# Patient Record
Sex: Male | Born: 1955 | Race: White | Hispanic: No | Smoking: Never smoker
Health system: Southern US, Community
[De-identification: ages and names within clinical notes are randomized; demographics above are authoritative.]

## PROBLEM LIST (undated history)

## (undated) HISTORY — PX: NO PAST SURGERIES: SHX2092

---

## 2016-08-20 ENCOUNTER — Ambulatory Visit
Admission: EM | Admit: 2016-08-20 | Discharge: 2016-08-20 | Disposition: A | Discharge status: Home / Self Care | Payer: BC Managed Care – PPO | Attending: Family Medicine | Admitting: Family Medicine

## 2016-08-20 ENCOUNTER — Ambulatory Visit (INDEPENDENT_AMBULATORY_CARE_PROVIDER_SITE_OTHER): Payer: BC Managed Care – PPO

## 2016-08-20 DIAGNOSIS — R109 Unspecified abdominal pain: Secondary | ICD-10-CM

## 2016-08-20 LAB — COMPREHENSIVE METABOLIC PANEL
ALT: 29 U/L (ref 17–63)
AST: 24 U/L (ref 15–41)
Albumin: 4.6 g/dL (ref 3.5–5.0)
Alkaline Phosphatase: 62 U/L (ref 38–126)
Anion gap: 10 (ref 5–15)
BUN: 16 mg/dL (ref 6–20)
CO2: 24 mmol/L (ref 22–32)
Calcium: 9 mg/dL (ref 8.9–10.3)
Chloride: 101 mmol/L (ref 101–111)
Creatinine, Ser: 1.11 mg/dL (ref 0.61–1.24)
GFR calc Af Amer: >60 mL/min (ref >60)
GFR calc non Af Amer: >60 mL/min (ref >60)
Glucose, Bld: 152 mg/dL — ABNORMAL HIGH (ref 65–99)
Potassium: 3.9 mmol/L (ref 3.5–5.1)
Sodium: 135 mmol/L (ref 135–145)
Total Bilirubin: 1.2 mg/dL (ref 0.3–1.2)
Total Protein: 8.3 g/dL — ABNORMAL HIGH (ref 6.5–8.1)

## 2016-08-20 LAB — CBC WITH DIFFERENTIAL/PLATELET
Basophils Absolute: 0 10*3/uL (ref 0–0.1)
Basophils Relative: 0 %
Eosinophils Absolute: 0 10*3/uL (ref 0–0.7)
Eosinophils Relative: 0 %
HCT: 50.2 % (ref 40.0–52.0)
Hemoglobin: 17.1 g/dL (ref 13.0–18.0)
Lymphocytes Relative: 3 %
Lymphs Abs: 0.7 10*3/uL — ABNORMAL LOW (ref 1.0–3.6)
MCH: 28.5 pg (ref 26.0–34.0)
MCHC: 34 g/dL (ref 32.0–36.0)
MCV: 83.8 fL (ref 80.0–100.0)
Monocytes Absolute: 2.6 10*3/uL — ABNORMAL HIGH (ref 0.2–1.0)
Monocytes Relative: 11 %
Neutro Abs: 20.7 10*3/uL — ABNORMAL HIGH (ref 1.4–6.5)
Neutrophils Relative %: 86 %
Platelets: 256 10*3/uL (ref 150–440)
RBC: 5.99 MIL/uL — ABNORMAL HIGH (ref 4.40–5.90)
RDW: 12.8 % (ref 11.5–14.5)
WBC: 24 10*3/uL — ABNORMAL HIGH (ref 3.8–10.6)

## 2016-08-20 LAB — URINALYSIS COMPLETE WITH MICROSCOPIC (ARMC ONLY)
Bacteria, UA: NONE SEEN
Bilirubin Urine: NEGATIVE
Glucose, UA: NEGATIVE mg/dL
Ketones, ur: NEGATIVE mg/dL
Leukocytes, UA: NEGATIVE
Nitrite: NEGATIVE
Protein, ur: 100 mg/dL — AB
Specific Gravity, Urine: >1.03 — ABNORMAL HIGH (ref 1.005–1.030)
Squamous Epithelial / LPF: NONE SEEN
pH: 5.5 (ref 5.0–8.0)

## 2016-08-20 LAB — LIPASE, BLOOD: Lipase: 19 U/L (ref 11–51)

## 2016-08-20 MED ORDER — ONDANSETRON 8 MG PO TBDP
8.0000 mg | ORAL_TABLET | Freq: Once | ORAL | Status: AC
  Administered 2016-08-20: 8 mg via ORAL

## 2016-08-20 MED ORDER — ONDANSETRON 8 MG PO TBDP: 8.0000 mg | ORAL_TABLET | Freq: Two times a day (BID) | ORAL | 0 refills | Status: DC

## 2016-08-20 NOTE — ED Provider Notes (Signed)
CSN: [id number]     Arrival date & time 08/20/16  4098 History   First MD Initiated Contact with Patient 08/20/16 1143     Chief Complaint  Patient presents with  . Abdominal Pain   (Consider location/radiation/quality/duration/timing/severity/associated sxs/prior Treatment) HPI    60 year old male who presents with right flank mid quadrant abdominal pain that began yesterday. He states that the pain is not constant but is densely affected by movement. If he lies recumbent and draws his legs upwards towards his chest pain is lessened. He has had some vomiting mostly yesterday with his last episode at 345 this morning. He denies any coffee ground vomitus or any blood in the vomitus. He has had no diarrhea has been slightly constipated. He attempted to have a bowel movement this morning with only a very small amount. He said no bleeding per rectum and has noticed no mucus. He has had kidney stones in the past and he states that this does not seem to be anywhere near that pain. Has been attempting to drink fluids and is able to tolerate water to limited degree. He has not been eating because of his nausea which she states is constant. He states that he does have hunger.      History reviewed. No pertinent past medical history. Past Surgical History:  Procedure Laterality Date  . NO PAST SURGERIES     Family History  Problem Relation Age of Onset  . CVA Mother   . Brain cancer Mother    Social History  Substance Use Topics  . Smoking status: Never Smoker  . Smokeless tobacco: Never Used  . Alcohol use Yes     Comment: occasionally    Review of Systems  Constitutional: Positive for activity change. Negative for chills, fatigue and fever.  Gastrointestinal: Positive for abdominal pain, constipation, nausea and vomiting. Negative for diarrhea.  All other systems reviewed and are negative.   Allergies  Penicillins  Home Medications   Prior to Admission medications   Medication  Sig Start Date End Date Taking? Authorizing Provider  aspirin 81 MG chewable tablet Chew 162 mg by mouth daily.   Yes Historical Provider, MD  ondansetron (ZOFRAN ODT) 8 MG disintegrating tablet Take 1 tablet (8 mg total) by mouth 2 (two) times daily. 08/20/16   Lutricia Feil, PA-C   Meds Ordered and Administered this Visit   Medications  ondansetron (ZOFRAN-ODT) disintegrating tablet 8 mg (8 mg Oral Given 08/20/16 1218)    BP 139/89 (BP Location: Left Arm)   Pulse 67   Temp 98 F (36.7 C) (Oral)   Resp 17   Ht 5\' 7"  (1.702 m)   Wt 190 lb (86.2 kg)   SpO2 97%   BMI 29.76 kg/m  No data found.   Physical Exam  Constitutional: He is oriented to person, place, and time. He appears well-developed and well-nourished. No distress.  HENT:  Head: Normocephalic and atraumatic.  Right Ear: External ear normal.  Left Ear: External ear normal.  Nose: Nose normal.  Mouth/Throat: Oropharynx is clear and moist.  Eyes: EOM are normal. Pupils are equal, round, and reactive to light.  Neck: Normal range of motion. Neck supple.  Cardiovascular: Normal rate, regular rhythm, normal heart sounds and intact distal pulses.  Exam reveals no gallop and no friction rub.   No murmur heard. Pulmonary/Chest: Effort normal. No respiratory distress. He has no wheezes. He has no rales. He exhibits no tenderness.  Abdominal: Soft. Bowel sounds are normal. He  exhibits no distension and no mass. There is tenderness. There is no rebound and no guarding. No hernia.  Examination of the abdomen shows a normal bowel sounds throughout. There is no guarding and no rebound present. Pain is sharply localized over the right flank at the mid quadrant pain and also medially towards the midline at the umbilical level. There is a negative Murphy's sign.  Musculoskeletal: Normal range of motion.  Neurological: He is alert and oriented to person, place, and time.  Skin: Skin is warm and dry. He is not diaphoretic.   Psychiatric: He has a normal mood and affect. His behavior is normal. Judgment and thought content normal.  Nursing note and vitals reviewed.   Urgent Care Course   Clinical Course    Procedures (including critical care time)  Labs Review Labs Reviewed  CBC WITH DIFFERENTIAL/PLATELET - Abnormal; Notable for the following:       Result Value   WBC 24.0 (*)    RBC 5.99 (*)    Neutro Abs 20.7 (*)    Lymphs Abs 0.7 (*)    Monocytes Absolute 2.6 (*)    All other components within normal limits  URINALYSIS COMPLETEWITH MICROSCOPIC ([facility] ONLY) - Abnormal; Notable for the following:    Color, Urine AMBER (*)    APPearance HAZY (*)    Specific Gravity, Urine >1.030 (*)    Hgb urine dipstick MODERATE (*)    Protein, ur 100 (*)    All other components within normal limits  COMPREHENSIVE METABOLIC PANEL - Abnormal; Notable for the following:    Glucose, Bld 152 (*)    Total Protein 8.3 (*)    All other components within normal limits  LIPASE, BLOOD    Imaging Review Dg Abd 2 Views  Result Date: 08/20/2016 CLINICAL DATA:  Right flank pain for 3-4 days with hematuria EXAM: ABDOMEN - 2 VIEW COMPARISON:  None FINDINGS: Heart size is slightly enlarged. Lung bases are clear. No free air beneath the diaphragm. Bowel gas pattern is nonspecific nonobstructed. Calcified phleboliths in the right pelvis. No pathologic calcifications are otherwise seen. IMPRESSION: Nonobstructed bowel-gas pattern. No radiopaque calculi are stain. If continued clinical suspicion for kidney or ureteral stone, recommend CT KUB. Electronically Signed   By: Jasmine PangKim  Fujinaga M.D.   On: 08/20/2016 14:16     Visual Acuity Review  Right Eye Distance:   Left Eye Distance:   Bilateral Distance:    Right Eye Near:   Left Eye Near:    Bilateral Near:     Medications  ondansetron (ZOFRAN-ODT) disintegrating tablet 8 mg (8 mg Oral Given 08/20/16 1218)      MDM   1. Acute right flank pain    Discharge  Medication List as of 08/20/2016  2:40 PM    START taking these medications   Details  ondansetron (ZOFRAN ODT) 8 MG disintegrating tablet Take 1 tablet (8 mg total) by mouth 2 (two) times daily., Starting Tue 08/20/2016, Normal      I discussed the findings of workup today to the patient. Has a history of kidney stone in the past although today he does not think it's as severe as it was then. Plain films failed to show any calculi. I told patient that the definitive test would be a CT KUB as recommended by radiology. He does have an elevated white count today with a neutrophil shift is afebrile and does not appear toxic. Not have an acute abdomen on today's examination. Recommended he Go to  the emergency room this afternoon however the patient prefers to go home. He has a 60 year old daughter that is waiting for him and he does not want to wait for additional testing. Told him that  if the stone is less than 5 mm he will pass this spontaneously and he was given a urine screen. He told us that he will go to the emergency room tomorrow if not improving or if he worsens tonight he will go at that point time. He refused any narcotic analgesics. We did provide him with some Zofran for his nausea so that he may continue with his hydration.    Lutricia FeilWilliam P Chanele Douglas, PA-C 08/20/16 1527

## 2016-08-20 NOTE — ED Triage Notes (Signed)
Patient complains of sharp lower right quadrant abdominal pain. Patient states that abdomen is very tender. Patient states that symptoms started 2 days ago and have been worsening. Patient states that pain is worse when pressure is applied to abdomen. Patient states that he vomited all day yesterday but this has resolved.

## 2016-08-21 ENCOUNTER — Encounter
Admission: EM | Disposition: A | Discharge status: Home / Self Care | Payer: Self-pay | Source: Home / Self Care | Attending: Surgery

## 2016-08-21 ENCOUNTER — Inpatient Hospital Stay: Payer: BC Managed Care – PPO | Admitting: Anesthesiology

## 2016-08-21 ENCOUNTER — Inpatient Hospital Stay
Admission: EM | Admit: 2016-08-21 | Discharge: 2016-08-30 | DRG: 340 | Disposition: A | Discharge status: Home / Self Care | Payer: BC Managed Care – PPO | Attending: Surgery | Admitting: Surgery

## 2016-08-21 ENCOUNTER — Emergency Department: Payer: BC Managed Care – PPO

## 2016-08-21 ENCOUNTER — Encounter: Payer: Self-pay | Admitting: Emergency Medicine

## 2016-08-21 DIAGNOSIS — K3532 Acute appendicitis with perforation and localized peritonitis, without abscess: Secondary | ICD-10-CM

## 2016-08-21 DIAGNOSIS — E119 Type 2 diabetes mellitus without complications: Secondary | ICD-10-CM | POA: Diagnosis present

## 2016-08-21 DIAGNOSIS — Z880 Allergy status to penicillin: Secondary | ICD-10-CM | POA: Diagnosis not present

## 2016-08-21 DIAGNOSIS — I10 Essential (primary) hypertension: Secondary | ICD-10-CM | POA: Diagnosis present

## 2016-08-21 DIAGNOSIS — K352 Acute appendicitis with generalized peritonitis, without abscess: Secondary | ICD-10-CM

## 2016-08-21 DIAGNOSIS — Z823 Family history of stroke: Secondary | ICD-10-CM | POA: Diagnosis not present

## 2016-08-21 DIAGNOSIS — T8143XA Infection following a procedure, organ and space surgical site, initial encounter: Secondary | ICD-10-CM

## 2016-08-21 DIAGNOSIS — Z7982 Long term (current) use of aspirin: Secondary | ICD-10-CM | POA: Diagnosis not present

## 2016-08-21 DIAGNOSIS — Z5331 Laparoscopic surgical procedure converted to open procedure: Secondary | ICD-10-CM

## 2016-08-21 DIAGNOSIS — L0291 Cutaneous abscess, unspecified: Secondary | ICD-10-CM

## 2016-08-21 DIAGNOSIS — Z87442 Personal history of urinary calculi: Secondary | ICD-10-CM

## 2016-08-21 DIAGNOSIS — T8149XA Infection following a procedure, other surgical site, initial encounter: Secondary | ICD-10-CM

## 2016-08-21 DIAGNOSIS — R1031 Right lower quadrant pain: Secondary | ICD-10-CM | POA: Diagnosis present

## 2016-08-21 HISTORY — PX: LAPAROSCOPIC APPENDECTOMY: SHX408

## 2016-08-21 LAB — URINALYSIS COMPLETE WITH MICROSCOPIC (ARMC ONLY)
BACTERIA UA: NONE SEEN
Bilirubin Urine: NEGATIVE
GLUCOSE, UA: 50 mg/dL — AB
Ketones, ur: NEGATIVE mg/dL
LEUKOCYTES UA: NEGATIVE
Nitrite: NEGATIVE
PH: 5 (ref 5.0–8.0)
Protein, ur: 30 mg/dL — AB
Specific Gravity, Urine: >1.06 — ABNORMAL HIGH (ref 1.005–1.030)

## 2016-08-21 LAB — SURGICAL PCR SCREEN
MRSA, PCR: NEGATIVE
Staphylococcus aureus: POSITIVE — AB

## 2016-08-21 LAB — COMPREHENSIVE METABOLIC PANEL
ALBUMIN: 4 g/dL (ref 3.5–5.0)
ALT: 29 U/L (ref 17–63)
ANION GAP: 12 (ref 5–15)
AST: 31 U/L (ref 15–41)
Alkaline Phosphatase: 55 U/L (ref 38–126)
BUN: 24 mg/dL — ABNORMAL HIGH (ref 6–20)
CO2: 24 mmol/L (ref 22–32)
Calcium: 9.1 mg/dL (ref 8.9–10.3)
Chloride: 99 mmol/L — ABNORMAL LOW (ref 101–111)
Creatinine, Ser: 1.48 mg/dL — ABNORMAL HIGH (ref 0.61–1.24)
GFR calc Af Amer: 58 mL/min — ABNORMAL LOW (ref >60)
GFR calc non Af Amer: 50 mL/min — ABNORMAL LOW (ref >60)
GLUCOSE: 166 mg/dL — AB (ref 65–99)
POTASSIUM: 3.8 mmol/L (ref 3.5–5.1)
SODIUM: 135 mmol/L (ref 135–145)
Total Bilirubin: 3.4 mg/dL — ABNORMAL HIGH (ref 0.3–1.2)
Total Protein: 7.8 g/dL (ref 6.5–8.1)

## 2016-08-21 LAB — CBC WITH DIFFERENTIAL/PLATELET
BASOS ABS: 0 10*3/uL (ref 0–0.1)
Basophils Relative: 0 %
Eosinophils Absolute: 0 10*3/uL (ref 0–0.7)
Eosinophils Relative: 0 %
HEMATOCRIT: 52.9 % — AB (ref 40.0–52.0)
Hemoglobin: 17.7 g/dL (ref 13.0–18.0)
LYMPHS PCT: 4 %
Lymphs Abs: 0.8 10*3/uL — ABNORMAL LOW (ref 1.0–3.6)
MCH: 28.7 pg (ref 26.0–34.0)
MCHC: 33.5 g/dL (ref 32.0–36.0)
MCV: 85.7 fL (ref 80.0–100.0)
MONO ABS: 1.9 10*3/uL — AB (ref 0.2–1.0)
MONOS PCT: 8 %
NEUTROS ABS: 20.4 10*3/uL — AB (ref 1.4–6.5)
Neutrophils Relative %: 88 %
Platelets: 279 10*3/uL (ref 150–440)
RBC: 6.18 MIL/uL — ABNORMAL HIGH (ref 4.40–5.90)
RDW: 12.9 % (ref 11.5–14.5)
WBC: 23.1 10*3/uL — ABNORMAL HIGH (ref 3.8–10.6)

## 2016-08-21 LAB — LIPASE, BLOOD: Lipase: 17 U/L (ref 11–51)

## 2016-08-21 LAB — LACTIC ACID, PLASMA: LACTIC ACID, VENOUS: 1.5 mmol/L (ref 0.5–1.9)

## 2016-08-21 LAB — TROPONIN I: Troponin I: 0.03 ng/mL (ref <0.03)

## 2016-08-21 SURGERY — APPENDECTOMY, LAPAROSCOPIC
Anesthesia: General | Wound class: Contaminated

## 2016-08-21 MED ORDER — ACETAMINOPHEN 10 MG/ML IV SOLN
INTRAVENOUS | Status: AC
  Filled 2016-08-21: qty 100

## 2016-08-21 MED ORDER — MEROPENEM-SODIUM CHLORIDE 500 MG/50ML IV SOLR
500.0000 mg | Freq: Once | INTRAVENOUS | Status: AC
  Administered 2016-08-21: 500 mg via INTRAVENOUS
  Filled 2016-08-21: qty 50

## 2016-08-21 MED ORDER — ENOXAPARIN SODIUM 40 MG/0.4ML ~~LOC~~ SOLN: 40.0000 mg | SUBCUTANEOUS | Status: DC

## 2016-08-21 MED ORDER — ROCURONIUM BROMIDE 100 MG/10ML IV SOLN
INTRAVENOUS | Status: DC | PRN
  Administered 2016-08-21 (×2): 10 mg via INTRAVENOUS
  Administered 2016-08-21: 40 mg via INTRAVENOUS
  Administered 2016-08-21: 10 mg via INTRAVENOUS

## 2016-08-21 MED ORDER — HEPARIN SODIUM (PORCINE) 5000 UNIT/ML IJ SOLN
INTRAMUSCULAR | Status: AC
  Filled 2016-08-21: qty 1

## 2016-08-21 MED ORDER — BUPIVACAINE HCL (PF) 0.25 % IJ SOLN
INTRAMUSCULAR | Status: AC
  Filled 2016-08-21: qty 30

## 2016-08-21 MED ORDER — ACETAMINOPHEN 650 MG RE SUPP: 650.0000 mg | Freq: Four times a day (QID) | RECTAL | Status: DC | PRN

## 2016-08-21 MED ORDER — HYDROMORPHONE HCL 1 MG/ML IJ SOLN: 1.0000 mg | INTRAMUSCULAR | Status: DC | PRN

## 2016-08-21 MED ORDER — INFLUENZA VAC SPLIT QUAD 0.5 ML IM SUSY: 0.5000 mL | PREFILLED_SYRINGE | INTRAMUSCULAR | Status: DC

## 2016-08-21 MED ORDER — ONDANSETRON HCL 4 MG/2ML IJ SOLN
4.0000 mg | Freq: Four times a day (QID) | INTRAMUSCULAR | Status: DC | PRN
  Administered 2016-08-21 (×2): 4 mg via INTRAVENOUS

## 2016-08-21 MED ORDER — ACETAMINOPHEN 10 MG/ML IV SOLN
INTRAVENOUS | Status: DC | PRN
  Administered 2016-08-21: 1000 mg via INTRAVENOUS

## 2016-08-21 MED ORDER — KCL IN DEXTROSE-NACL 20-5-0.45 MEQ/L-%-% IV SOLN
INTRAVENOUS | Status: DC
  Administered 2016-08-21 – 2016-08-28 (×10): via INTRAVENOUS
  Administered 2016-08-29: 1000 mL via INTRAVENOUS
  Filled 2016-08-21 (×20): qty 1000

## 2016-08-21 MED ORDER — LIDOCAINE HCL (CARDIAC) 20 MG/ML IV SOLN
INTRAVENOUS | Status: DC | PRN
  Administered 2016-08-21: 100 mg via INTRAVENOUS

## 2016-08-21 MED ORDER — OXYCODONE HCL 5 MG PO TABS: 5.0000 mg | ORAL_TABLET | Freq: Once | ORAL | Status: DC | PRN

## 2016-08-21 MED ORDER — LACTATED RINGERS IV SOLN
INTRAVENOUS | Status: DC | PRN
  Administered 2016-08-21: 15:00:00 via INTRAVENOUS

## 2016-08-21 MED ORDER — ONDANSETRON 4 MG PO TBDP: 4.0000 mg | ORAL_TABLET | Freq: Four times a day (QID) | ORAL | Status: DC | PRN

## 2016-08-21 MED ORDER — CIPROFLOXACIN IN D5W 400 MG/200ML IV SOLN
400.0000 mg | Freq: Two times a day (BID) | INTRAVENOUS | Status: DC
  Administered 2016-08-21 – 2016-08-24 (×7): 400 mg via INTRAVENOUS
  Filled 2016-08-21 (×9): qty 200

## 2016-08-21 MED ORDER — SODIUM CHLORIDE 0.9 % IV SOLN
INTRAVENOUS | Status: DC | PRN
  Administered 2016-08-21: 50 mL via INTRAMUSCULAR

## 2016-08-21 MED ORDER — FENTANYL CITRATE (PF) 100 MCG/2ML IJ SOLN
INTRAMUSCULAR | Status: AC
  Filled 2016-08-21: qty 2

## 2016-08-21 MED ORDER — SODIUM CHLORIDE 0.9 % IV SOLN: 500.0000 mg | Freq: Once | INTRAVENOUS | Status: DC

## 2016-08-21 MED ORDER — IOPAMIDOL (ISOVUE-300) INJECTION 61%
30.0000 mL | Freq: Once | INTRAVENOUS | Status: AC | PRN
  Administered 2016-08-21: 30 mL via ORAL

## 2016-08-21 MED ORDER — DEXAMETHASONE SODIUM PHOSPHATE 10 MG/ML IJ SOLN
INTRAMUSCULAR | Status: DC | PRN
  Administered 2016-08-21: 10 mg via INTRAVENOUS

## 2016-08-21 MED ORDER — SODIUM CHLORIDE 0.9 % IV BOLUS (SEPSIS)
1000.0000 mL | Freq: Once | INTRAVENOUS | Status: AC
  Administered 2016-08-21: 1000 mL via INTRAVENOUS

## 2016-08-21 MED ORDER — METRONIDAZOLE IN NACL 5-0.79 MG/ML-% IV SOLN
500.0000 mg | Freq: Three times a day (TID) | INTRAVENOUS | Status: DC
  Administered 2016-08-21 – 2016-08-25 (×11): 500 mg via INTRAVENOUS
  Filled 2016-08-21 (×14): qty 100

## 2016-08-21 MED ORDER — FENTANYL CITRATE (PF) 100 MCG/2ML IJ SOLN
INTRAMUSCULAR | Status: DC | PRN
  Administered 2016-08-21: 50 ug via INTRAVENOUS
  Administered 2016-08-21 (×2): 100 ug via INTRAVENOUS

## 2016-08-21 MED ORDER — PIPERACILLIN-TAZOBACTAM 3.375 G IVPB 30 MIN: 3.3750 g | Freq: Once | INTRAVENOUS | Status: DC

## 2016-08-21 MED ORDER — MORPHINE SULFATE (PF) 4 MG/ML IV SOLN: 4.0000 mg | INTRAVENOUS | Status: DC | PRN

## 2016-08-21 MED ORDER — PROPOFOL 10 MG/ML IV BOLUS
INTRAVENOUS | Status: DC | PRN
  Administered 2016-08-21: 150 mg via INTRAVENOUS

## 2016-08-21 MED ORDER — SUCCINYLCHOLINE CHLORIDE 20 MG/ML IJ SOLN
INTRAMUSCULAR | Status: DC | PRN
  Administered 2016-08-21: 100 mg via INTRAVENOUS

## 2016-08-21 MED ORDER — BUPIVACAINE HCL (PF) 0.25 % IJ SOLN
INTRAMUSCULAR | Status: DC | PRN
  Administered 2016-08-21: 30 mL

## 2016-08-21 MED ORDER — PHENYLEPHRINE HCL 10 MG/ML IJ SOLN
INTRAMUSCULAR | Status: DC | PRN
  Administered 2016-08-21 (×3): 100 ug via INTRAVENOUS

## 2016-08-21 MED ORDER — OXYCODONE HCL 5 MG/5ML PO SOLN: 5.0000 mg | Freq: Once | ORAL | Status: DC | PRN

## 2016-08-21 MED ORDER — IOPAMIDOL (ISOVUE-300) INJECTION 61%
100.0000 mL | Freq: Once | INTRAVENOUS | Status: AC | PRN
  Administered 2016-08-21: 100 mL via INTRAVENOUS

## 2016-08-21 MED ORDER — MIDAZOLAM HCL 2 MG/2ML IJ SOLN
INTRAMUSCULAR | Status: DC | PRN
  Administered 2016-08-21: 2 mg via INTRAVENOUS

## 2016-08-21 MED ORDER — HYDROCODONE-ACETAMINOPHEN 5-325 MG PO TABS
1.0000 | ORAL_TABLET | ORAL | Status: DC | PRN
  Administered 2016-08-22 – 2016-08-23 (×3): 1 via ORAL
  Administered 2016-08-23: 2 via ORAL
  Administered 2016-08-24: 1 via ORAL
  Filled 2016-08-21 (×3): qty 1
  Filled 2016-08-21: qty 2
  Filled 2016-08-21: qty 1
  Filled 2016-08-21: qty 2

## 2016-08-21 MED ORDER — ACETAMINOPHEN 325 MG PO TABS: 650.0000 mg | ORAL_TABLET | Freq: Four times a day (QID) | ORAL | Status: DC | PRN

## 2016-08-21 MED ORDER — SUGAMMADEX SODIUM 200 MG/2ML IV SOLN
INTRAVENOUS | Status: DC | PRN
  Administered 2016-08-21: 168.8 mg via INTRAVENOUS

## 2016-08-21 MED ORDER — KETOROLAC TROMETHAMINE 30 MG/ML IJ SOLN
30.0000 mg | Freq: Once | INTRAMUSCULAR | Status: AC
Start: 2016-08-21 — End: 2016-08-21
  Administered 2016-08-21: 30 mg via INTRAVENOUS
  Filled 2016-08-21: qty 1

## 2016-08-21 MED ORDER — FENTANYL CITRATE (PF) 100 MCG/2ML IJ SOLN
25.0000 ug | INTRAMUSCULAR | Status: DC | PRN
  Administered 2016-08-21 (×3): 50 ug via INTRAVENOUS

## 2016-08-21 SURGICAL SUPPLY — 57 items
BULB RESERV EVAC DRAIN JP 100C (MISCELLANEOUS) ×3 IMPLANT
CANISTER SUCT 3000ML (MISCELLANEOUS) ×3 IMPLANT
CHLORAPREP W/TINT 26ML (MISCELLANEOUS) ×3 IMPLANT
CLOSURE WOUND 1/4X4 (GAUZE/BANDAGES/DRESSINGS) ×1
CUTTER FLEX LINEAR 45M (STAPLE) ×3 IMPLANT
DRAIN CHANNEL JP 19F (MISCELLANEOUS) ×3 IMPLANT
DRAIN PENROSE 5/8X18 LTX STRL (WOUND CARE) ×3 IMPLANT
DRESSING TELFA 4X3 1S ST N-ADH (GAUZE/BANDAGES/DRESSINGS) ×3 IMPLANT
DRSG TEGADERM 2-3/8X2-3/4 SM (GAUZE/BANDAGES/DRESSINGS) ×6 IMPLANT
DRSG TELFA 3X8 NADH (GAUZE/BANDAGES/DRESSINGS) ×3 IMPLANT
ELECT REM PT RETURN 9FT ADLT (ELECTROSURGICAL) ×3
ELECTRODE REM PT RTRN 9FT ADLT (ELECTROSURGICAL) ×1 IMPLANT
GAUZE SPONGE 4X4 12PLY STRL (GAUZE/BANDAGES/DRESSINGS) ×3 IMPLANT
GLOVE BIO SURGEON STRL SZ7.5 (GLOVE) ×15 IMPLANT
GLOVE INDICATOR 8.0 STRL GRN (GLOVE) ×3 IMPLANT
GOWN STRL REUS W/ TWL LRG LVL3 (GOWN DISPOSABLE) ×4 IMPLANT
GOWN STRL REUS W/TWL LRG LVL3 (GOWN DISPOSABLE) ×8
GRASPER SUT TROCAR 14GX15 (MISCELLANEOUS) ×3 IMPLANT
HANDLE YANKAUER SUCT BULB TIP (MISCELLANEOUS) ×3 IMPLANT
IRRIGATION STRYKERFLOW (MISCELLANEOUS) ×1 IMPLANT
IRRIGATOR STRYKERFLOW (MISCELLANEOUS) ×3
IV NS 1000ML (IV SOLUTION) ×4
IV NS 1000ML BAXH (IV SOLUTION) ×2 IMPLANT
KIT RM TURNOVER STRD PROC AR (KITS) ×3 IMPLANT
NEEDLE FILTER BLUNT 18X 1/2SAF (NEEDLE) ×2
NEEDLE FILTER BLUNT 18X1 1/2 (NEEDLE) ×1 IMPLANT
NEEDLE HYPO 25X1 1.5 SAFETY (NEEDLE) ×3 IMPLANT
NEEDLE INSUFFLATION 14GA 120MM (NEEDLE) ×3 IMPLANT
NS IRRIG 1000ML POUR BTL (IV SOLUTION) ×9 IMPLANT
NS IRRIG 500ML POUR BTL (IV SOLUTION) ×3 IMPLANT
PACK LAP CHOLECYSTECTOMY (MISCELLANEOUS) ×3 IMPLANT
PAD ABD DERMACEA PRESS 5X9 (GAUZE/BANDAGES/DRESSINGS) ×6 IMPLANT
PENCIL ELECTRO HAND CTR (MISCELLANEOUS) ×3 IMPLANT
POUCH ENDO CATCH 10MM SPEC (MISCELLANEOUS) ×3 IMPLANT
RELOAD 45 VASCULAR/THIN (ENDOMECHANICALS) ×3 IMPLANT
RELOAD STAPLE TA45 3.5 REG BLU (ENDOMECHANICALS) ×3 IMPLANT
SCISSORS METZENBAUM CVD 33 (INSTRUMENTS) IMPLANT
SPONGE DRAIN TRACH 4X4 STRL 2S (GAUZE/BANDAGES/DRESSINGS) ×3 IMPLANT
SPONGE LAP 18X18 5 PK (GAUZE/BANDAGES/DRESSINGS) ×9 IMPLANT
STRIP CLOSURE SKIN 1/4X4 (GAUZE/BANDAGES/DRESSINGS) ×2 IMPLANT
SUT ETHILON 3-0 FS-10 30 BLK (SUTURE) ×9
SUT ETHILON 5-0 FS-2 18 BLK (SUTURE) ×6 IMPLANT
SUT MAXON ABS #0 GS21 30IN (SUTURE) ×9 IMPLANT
SUT PDS AB 0 CT1 27 (SUTURE) ×3 IMPLANT
SUT SILK 3-0 (SUTURE) ×2
SUT SILK 3-0 SH-1 18XCR BRD (SUTURE) ×1
SUT VIC AB 0 CT2 27 (SUTURE) ×3 IMPLANT
SUT VIC AB 3-0 54X BRD REEL (SUTURE) ×1 IMPLANT
SUT VIC AB 3-0 BRD 54 (SUTURE) ×2
SUTURE EHLN 3-0 FS-10 30 BLK (SUTURE) ×3 IMPLANT
SUTURE SILK 3-0 SH-1 18XCR BRD (SUTURE) ×1 IMPLANT
SYR BULB IRRIG 60ML STRL (SYRINGE) ×3 IMPLANT
SYRINGE 10CC LL (SYRINGE) ×3 IMPLANT
TROCAR XCEL 12X100 BLDLESS (ENDOMECHANICALS) ×3 IMPLANT
TROCAR Z-THREAD FIOS 11X100 BL (TROCAR) ×3 IMPLANT
TROCAR Z-THREAD SLEEVE 11X100 (TROCAR) ×3 IMPLANT
TUBING INSUFFLATOR HI FLOW (MISCELLANEOUS) ×3 IMPLANT

## 2016-08-21 NOTE — Op Note (Signed)
08/21/2016  4:27 PM  PATIENT:  Andrew Hopkins  60 y.o. male  PRE-OPERATIVE DIAGNOSIS:  Acute Appendicitis   POST-OPERATIVE DIAGNOSIS:  Acute Appendicitis   PROCEDURE:  Procedure(s): APPENDECTOMY LAPAROSCOPIC Converted to Open Appendectomy (N/A)  SURGEON:  Surgeon(s) and Role:    * Tiney Rougealph Ely III, MD - Primary   ASSISTANTS: none   ANESTHESIA:   general  EBL:  Total I/O In: 2995.4 [I.V.:48.3; IV Piggyback:2947.1] Out: 400 [Urine:200; Blood:200]   DRAINS: Penrose drain in the wound   LOCAL MEDICATIONS USED:  MARCAINE      DISPOSITION OF SPECIMEN:  PATHOLOGY   DICTATION: .Dragon Dictation with the patient supine position and after induction appropriate general anesthesia the patient was prepped ChloraPrep and draped sterile towels. The patient was placed headdown feet up position. A small infraumbilical incision was made standard fashion carried down bluntly through subcutaneous tissue. First needle was used cannulate peritoneal cavity. CO2 was insufflated to appropriate pressure measurements. When approximately 2 L of CO2 were instilled a varies needle was withdrawn and an 11 mm port inserted in the peritoneal cavity. Intraperitoneal position was confirmed and CO2 was reinsufflated.  A midepigastric transverse incision was made 11 mm port inserted under direct vision. Suprapubic port was placed using a 12 port under direct vision. The right lower quadrant was investigated. There was significant inflammatory change obvious purulence and evidence for possible right lower quadrant. The cecum was adherent to the right pelvic sidewall. Tedious dissection was required to expose the appendix. I could just see the tip of it but it was stuck down in the retroperitoneal attachment and I could not move it. In attempting to manipulate it I created some bleeding from the mesoappendix. At that point I elected to perform an open procedure.  A right lower quadrant incision was made transversely and  carried down through the subcutaneous tissue using Bovie cautery. The anterior fascia was identified and divided. The rectus muscle was divided using the Bovie electrocautery. The epigastric vessels were identified and clamped divided and ligated with 3-0 Vicryl. Posterior fascia peritoneum were opened. The abdomen was then desufflated. The bleeding was controlled with Bovie electrocautery. The appendix had to be finger fractured off the pelvic sidewall elevated into the incision. There are 2 areas of necrosis both with large panniculus. Both fecaliths were retrieved. The mesoappendix divided with single application of the Endo GIA stapling device carrying a white load. Appendix base appeared to be necrotic and so was stapled and then imbricated with 3-0 silk. A layer of peritoneum and fatty attachments off of the ileum were laid over the closure and sutured in place with 3-0 silk.  The area was then copiously irrigated with warm saline solution. Because of localized infection in the area where the appendix had been attached a 19 [place] Blake drain was inserted through a separate stab wound and placed in that area. It was secured with 3-0 nylon. The posterior fascia closed with running suture of 0 PDS. The anterior fascia with interrupted figure-of-eight sutures of 0 Maxon. Subcutaneous space was irrigated. A Penrose drain was placed in subcutaneous space and skin closed loosely over with 3-0 nylon in a vertical mattress fashion. The skin incisions for the ports were closed with 5-0 nylon. The area was infiltrated with 0.25% Marcaine for post operative pain control. Sterile dressings were applied. The patient returned recovery room having tolerated the procedure well. Sponge and needle count were correct 2 in the operating room.  PLAN OF CARE: Admit to inpatient  PATIENT DISPOSITION:  PACU - guarded condition.   Tiney Rougealph Ely III, MD

## 2016-08-21 NOTE — ED Notes (Signed)
Report given to Eunice Blaseebbie, RN in the OR>

## 2016-08-21 NOTE — ED Triage Notes (Signed)
Pt ambulatory to room, reports hematuria and right lower abd pain since last weekend.  [facility], told to return to ED if sx worsened for CT.  Pt NAD at this time, resp equal and unlabored, skin warm and dry.

## 2016-08-21 NOTE — ED Notes (Signed)
Pt returns from CT at this time. NAD.  

## 2016-08-21 NOTE — Anesthesia Procedure Notes (Signed)
Procedure Name: Intubation Date/Time: 08/21/2016 3:08 PM Performed by: Junious SilkNOLES, Honore Wipperfurth Pre-anesthesia Checklist: Patient identified, Patient being monitored, Timeout performed, Emergency Drugs available and Suction available Patient Re-evaluated:Patient Re-evaluated prior to inductionOxygen Delivery Method: Circle system utilized Preoxygenation: Pre-oxygenation with 100% oxygen Intubation Type: IV induction Ventilation: Mask ventilation without difficulty Laryngoscope Size: Mac and 3 Grade View: Grade I Tube type: Oral Tube size: 7.5 mm Number of attempts: 1 Airway Equipment and Method: Stylet Placement Confirmation: ETT inserted through vocal cords under direct vision,  positive ETCO2 and breath sounds checked- equal and bilateral Secured at: 21 cm Tube secured with: Tape Dental Injury: Teeth and Oropharynx as per pre-operative assessment

## 2016-08-21 NOTE — Anesthesia Postprocedure Evaluation (Signed)
Anesthesia Post Note  Patient: Andrew Hopkins  Procedure(s) Performed: Procedure(s) (LRB): APPENDECTOMY LAPAROSCOPIC Converted to Open Appendectomy (N/A)  Patient location during evaluation: PACU Anesthesia Type: General Level of consciousness: awake and alert Pain management: pain level controlled Vital Signs Assessment: post-procedure vital signs reviewed and stable Respiratory status: spontaneous breathing, nonlabored ventilation, respiratory function stable and patient connected to nasal cannula oxygen Cardiovascular status: blood pressure returned to baseline and stable Postop Assessment: no signs of nausea or vomiting Anesthetic complications: no    Last Vitals:  Vitals:   08/21/16 1707 08/21/16 1723  BP: (!) 127/92 128/78  Pulse: 99 98  Resp:  17  Temp: 36.7 C 36.8 C    Last Pain:  Vitals:   08/21/16 1733  TempSrc:   PainSc: 5                  Lenard SimmerAndrew Jermarcus Mcfadyen

## 2016-08-21 NOTE — ED Notes (Signed)
Pt drinking contrast at this time.

## 2016-08-21 NOTE — H&P (Signed)
Andrew Hopkins is a 60 y.o. male  with several day history of abdominal pain.  HPI: He presented to acute care with some hematuria yesterday thinking that he may have a kidney stone. His pain was primarily right lower quadrant. He's had pain on and off for a month. He denies any anorexia nausea or vomiting. He denies any fever or chills. An elevated white blood cell count of 24,000 referred to the emergency room for further evaluation. Workup in the emergency room identified what appears to be free air in the abdomen free fluid appendiceal changes consistent with a ruptured appendix. The surgical service was consulted.  He denies any history of hepatitis yellow jaundice pancreatitis peptic ulcer disease gallbladder disease or diverticulitis. He's had no previous abdominal surgery. He has a cardiac disease hypertension diabetes or thyroid problems.  History reviewed. No pertinent past medical history. Past Surgical History:  Procedure Laterality Date  . NO PAST SURGERIES     Social History   Social History  . Marital status: Widowed    Spouse name: N/A  . Number of children: N/A  . Years of education: N/A   Social History Main Topics  . Smoking status: Never Smoker  . Smokeless tobacco: Never Used  . Alcohol use Yes     Comment: occasionally  . Drug use: No  . Sexual activity: Not Asked   Other Topics Concern  . None   Social History Narrative  . None     Review of Systems  Constitutional: Negative for chills and fever.  HENT: Negative.   Eyes: Negative.   Respiratory: Negative for cough, sputum production, shortness of breath and wheezing.   Cardiovascular: Negative for chest pain and palpitations.  Gastrointestinal: Positive for abdominal pain and heartburn. Negative for nausea and vomiting.  Genitourinary: Positive for hematuria.  Musculoskeletal: Negative.   Skin: Negative.   Neurological: Negative.   Psychiatric/Behavioral: Negative.      PHYSICAL EXAM: BP 105/76  (BP Location: Left Arm)   Pulse 93   Temp 98 F (36.7 C) (Oral)   Resp 18   Ht 5\' 7"  (1.702 m)   Wt 86.2 kg (190 lb)   SpO2 96%   BMI 29.76 kg/m   Physical Exam  Constitutional: He is oriented to person, place, and time. He appears well-developed and well-nourished. No distress.  HENT:  Head: Normocephalic and atraumatic.  Eyes: EOM are normal. Pupils are equal, round, and reactive to light.  Neck: Normal range of motion. Neck supple.  Cardiovascular: Normal rate, regular rhythm and normal heart sounds.   Pulmonary/Chest: Effort normal and breath sounds normal. No respiratory distress. He has no wheezes.  Abdominal: Bowel sounds are normal. He exhibits distension. There is tenderness. There is rebound and guarding.  Musculoskeletal: Normal range of motion. He exhibits no edema or deformity.  Neurological: He is alert and oriented to person, place, and time.  Skin: He is not diaphoretic.  Psychiatric: He has a normal mood and affect. His behavior is normal. Thought content normal.   His abdomen is tense with marked right lower quadrant tenderness guarding and some rebound with referred rebound.  Impression/Plan: I have independently reviewed his CT scan and his laboratory values. His workup suggests an acute appendicitis with likely rupture. Because of his age generally good health we will taken the operating room for appendectomy and drainage of what appears to be developing infection in the right lower quadrant. He is in agreement with this plan. Risks benefits and options of been  outlined and accepted. Risk of subsequent infections also been identified for the patient.   Tiney Rougealph Ely III, MD  08/21/2016, 10:19 AM

## 2016-08-21 NOTE — Anesthesia Preprocedure Evaluation (Signed)
Anesthesia Evaluation  Patient identified by MRN, date of birth, ID band Patient awake    Reviewed: Allergy & Precautions, H&P , NPO status , Patient's Chart, lab work & pertinent test results  Airway Mallampati: III  TM Distance: <3 FB Neck ROM: limited    Dental no notable dental hx. (+) Poor Dentition, Chipped   Pulmonary neg pulmonary ROS, neg shortness of breath,    Pulmonary exam normal breath sounds clear to auscultation       Cardiovascular Exercise Tolerance: Good (-) angina(-) Past MI and (-) DOE negative cardio ROS Normal cardiovascular exam Rhythm:regular Rate:Normal     Neuro/Psych negative neurological ROS  negative psych ROS   GI/Hepatic Neg liver ROS, GERD  Controlled,  Endo/Other  negative endocrine ROS  Renal/GU      Musculoskeletal   Abdominal   Peds  Hematology negative hematology ROS (+)   Anesthesia Other Findings Acute appendicitis with concern for Sepsis   BMI    Body Mass Index:  29.13 kg/m      Reproductive/Obstetrics negative OB ROS                             Anesthesia Physical Anesthesia Plan  ASA: III  Anesthesia Plan: General ETT   Post-op Pain Management:    Induction:   Airway Management Planned:   Additional Equipment:   Intra-op Plan:   Post-operative Plan:   Informed Consent: I have reviewed the patients History and Physical, chart, labs and discussed the procedure including the risks, benefits and alternatives for the proposed anesthesia with the patient or authorized representative who has indicated his/her understanding and acceptance.     Plan Discussed with: Anesthesiologist, CRNA and Surgeon  Anesthesia Plan Comments:         Anesthesia Quick Evaluation

## 2016-08-21 NOTE — Transfer of Care (Signed)
Immediate Anesthesia Transfer of Care Note  Patient: Mel AlmondKevin Bouse  Procedure(s) Performed: Procedure(s): APPENDECTOMY LAPAROSCOPIC Converted to Open Appendectomy (N/A)  Patient Location: PACU  Anesthesia Type:General  Level of Consciousness: sedated  Airway & Oxygen Therapy: Patient Spontanous Breathing and Patient connected to face mask oxygen  Post-op Assessment: Report given to RN and Post -op Vital signs reviewed and stable  Post vital signs: Reviewed and stable  Last Vitals:  Vitals:   08/21/16 1341 08/21/16 1625  BP: 131/79 121/75  Pulse: (!) 104 98  Resp: 16 15  Temp: 37.1 C 36.8 C    Last Pain:  Vitals:   08/21/16 1341  TempSrc: Tympanic  PainSc: 5          Complications: No apparent anesthesia complications

## 2016-08-21 NOTE — ED Provider Notes (Signed)
[facility] Emergency Department Provider Note ____________________________________________   I have reviewed the triage vital signs and the triage nursing note.  HISTORY  Chief Complaint Hematuria and Abdominal Pain   Historian Patient  HPI Andrew Hopkins is a 60 y.o. male presenting here after seeing urgent care yesterday for right flank pain and hematuria for a few days. Pain was moderate. Patient was found have elevated white blood cell count and hematuria and recommended to come to the ED for further evaluation and possible CT scan per patient account and with review of the PA record.  He has nausea medicine at home and has continued to have decreased by mouth intake, without vomiting. He has not had a fever. Yesterday he describes that at one point though right flank pain going into his right mid abdomen and up into his chest and over to his right shoulder. Overnight no chest pain or trouble breathing. No fever. No diarrhea. He had a small amount of hematuria today.  He has had a kidney stone in the past, and pass it on his own. This had felt less severe, but overnight pain was moderate to severe.    History reviewed. No pertinent past medical history. Kidney stone  There are no active problems to display for this patient.   Past Surgical History:  Procedure Laterality Date  . NO PAST SURGERIES      Prior to Admission medications   Medication Sig Start Date End Date Taking? Authorizing Provider  aspirin 81 MG chewable tablet Chew 162 mg by mouth daily.    Historical Provider, MD  ondansetron (ZOFRAN ODT) 8 MG disintegrating tablet Take 1 tablet (8 mg total) by mouth 2 (two) times daily. 08/20/16   Lutricia Feil, PA-C    Allergies  Allergen Reactions  . Penicillins     As a child    Family History  Problem Relation Age of Onset  . CVA Mother   . Brain cancer Mother     Social History Social History  Substance Use Topics  . Smoking  status: Never Smoker  . Smokeless tobacco: Never Used  . Alcohol use Yes     Comment: occasionally    Review of Systems  Constitutional: Negative for fever. Eyes: Negative for visual changes. ENT: Negative for sore throat. Cardiovascular: Negative for chest pain. Respiratory: Negative for shortness of breath. Gastrointestinal: Negative for vomiting and diarrhea. Genitourinary: Negative for dysuria. Positive for hematuria Musculoskeletal: Negative for extremity pain. Skin: Negative for rash. Neurological: Negative for headache. 10 point Review of Systems otherwise negative ____________________________________________   PHYSICAL EXAM:  VITAL SIGNS: ED Triage Vitals  Enc Vitals Group     BP 08/21/16 0645 112/76     Pulse Rate 08/21/16 0645 (!) 128     Resp 08/21/16 0645 18     Temp 08/21/16 0645 98 F (36.7 C)     Temp Source 08/21/16 0645 Oral     SpO2 08/21/16 0645 100 %     Weight 08/21/16 0646 190 lb (86.2 kg)     Height 08/21/16 0646 5\' 7"  (1.702 m)     Head Circumference --      Peak Flow --      Pain Score 08/21/16 0646 6     Pain Loc --      Pain Edu? --      Excl. in GC? --      Constitutional: Alert and oriented. Well appearing and in no distress. HEENT   Head:  Normocephalic and atraumatic.      Eyes: Conjunctivae are normal. PERRL. Normal extraocular movements.      Ears:         Nose: No congestion/rhinnorhea.   Mouth/Throat: Mucous membranes are moist.   Neck: No stridor. Cardiovascular/Chest: Normal rate, regular rhythm.  No murmurs, rubs, or gallops. Respiratory: Normal respiratory effort without tachypnea nor retractions. Breath sounds are clear and equal bilaterally. No wheezes/rales/rhonchi. Gastrointestinal: Soft. No distention, no guarding, no rebound.  Moderate tenderness diffusely especially tender in the right lower quadrant. He has some referred pain on the left lower quadrant over into the right lower quadrant. Moderate tenderness  in the right side of the abdomen with right lower quadrant up to the right upper quadrant  Genitourinary/rectal:Deferred Musculoskeletal: Nontender with normal range of motion in all extremities. No joint effusions.  No lower extremity tenderness.  No edema. Neurologic:  Normal speech and language. No gross or focal neurologic deficits are appreciated. Skin:  Skin is warm, dry and intact. No rash noted. Psychiatric: Mood and affect are normal. Speech and behavior are normal. Patient exhibits appropriate insight and judgment.   ____________________________________________  LABS (pertinent positives/negatives)  Labs Reviewed  COMPREHENSIVE METABOLIC PANEL - Abnormal; Notable for the following:       Result Value   Chloride 99 (*)    Glucose, Bld 166 (*)    BUN 24 (*)    Creatinine, Ser 1.48 (*)    Total Bilirubin 3.4 (*)    GFR calc non Af Amer 50 (*)    GFR calc Af Amer 58 (*)    All other components within normal limits  CBC WITH DIFFERENTIAL/PLATELET - Abnormal; Notable for the following:    WBC 23.1 (*)    RBC 6.18 (*)    HCT 52.9 (*)    Neutro Abs 20.4 (*)    Lymphs Abs 0.8 (*)    Monocytes Absolute 1.9 (*)    All other components within normal limits  TROPONIN I - Abnormal; Notable for the following:    Troponin I 0.03 (*)    All other components within normal limits  CULTURE, BLOOD (ROUTINE X 2)  CULTURE, BLOOD (ROUTINE X 2)  URINE CULTURE  LIPASE, BLOOD  URINALYSIS COMPLETEWITH MICROSCOPIC ([facility] ONLY)  LACTIC ACID, PLASMA  LACTIC ACID, PLASMA    ____________________________________________    EKG I, Governor Rooksebecca Aeisha Minarik, MD, the attending physician have personally viewed and interpreted all ECGs.  113 bpm. Sinus tachycardia. Narrow QRS. Nonspecific ST-T wave  ____________________________________________  RADIOLOGY All Xrays were viewed by me. Imaging interpreted by Radiologist.  CT abdomen and pelvis with contrast: IMPRESSION: Acute appendicitis with extensive  periappendiceal inflammatory changes and free intraperitoneal air/fluid consistent with perforation.  Bibasilar atelectasis greater on RIGHT. __________________________________________  PROCEDURES  Procedure(s) performed: None  Critical Care performed: None  ____________________________________________   ED COURSE / ASSESSMENT AND PLAN  Pertinent labs & imaging results that were available during my care of the patient were reviewed by me and considered in my medical decision making (see chart for details).   Initially diagnosis seems clinically consistent with kidney stone given the history of prior kidney stone, hematuria, and initially of right flank pain. However there are couple of unusual features making me concerned about whether or not clinical diagnosis of kidney stone is the only diagnosis here. He had an elevated white blood cell count in the mid 20s yesterday, I will repeat this. He has more significant abdominal tenderness that I would expect for simple  kidney stone.  We discussed risks versus benefit of CT scan, and I discussed that at this point I would recommend IV contrast CT scan as it is important to me to rule out alternative diagnoses than confirm kidney stone at the expense of missing some other causes including diverticulitis or appendicitis, etc.  I think vascular emergency to be less likely.  CT result called to me by the radiologist, consistent with perforated appendicitis.  I will conservatively place patient on the sepsis protocol, no hypotension, no fever, but tachycardia (I feel more likely driven by dehydration as evidence by acute renal failure), and elevated wbc and perforated appendicitis.  Unknown allergy to pcn as a child, will give imipenem.    CONSULTATIONS:   Dr. Michela PitcherEly, general surgery for consult and admission.   Patient / Family / Caregiver informed of clinical course, medical decision-making process, and agree with  plan.    ___________________________________________   FINAL CLINICAL IMPRESSION(S) / ED DIAGNOSES   Final diagnoses:  Appendicitis with perforation              Note: This dictation was prepared with Dragon dictation. Any transcriptional errors that result from this process are unintentional    Governor Rooksebecca Khylee Algeo, MD 08/21/16 [phone]

## 2016-08-21 NOTE — ED Notes (Signed)
Pt transported to CT at this time.

## 2016-08-22 ENCOUNTER — Encounter: Payer: Self-pay | Admitting: Surgery

## 2016-08-22 LAB — CBC
HCT: 41.1 % (ref 40.0–52.0)
Hemoglobin: 14.2 g/dL (ref 13.0–18.0)
MCH: 29 pg (ref 26.0–34.0)
MCHC: 34.4 g/dL (ref 32.0–36.0)
MCV: 84.1 fL (ref 80.0–100.0)
PLATELETS: 194 10*3/uL (ref 150–440)
RBC: 4.89 MIL/uL (ref 4.40–5.90)
RDW: 12.8 % (ref 11.5–14.5)
WBC: 16.7 10*3/uL — ABNORMAL HIGH (ref 3.8–10.6)

## 2016-08-22 LAB — BASIC METABOLIC PANEL
Anion gap: 5 (ref 5–15)
BUN: 21 mg/dL — AB (ref 6–20)
CALCIUM: 7.9 mg/dL — AB (ref 8.9–10.3)
CHLORIDE: 103 mmol/L (ref 101–111)
CO2: 28 mmol/L (ref 22–32)
CREATININE: 1.21 mg/dL (ref 0.61–1.24)
GFR calc Af Amer: >60 mL/min (ref >60)
GFR calc non Af Amer: >60 mL/min (ref >60)
Glucose, Bld: 142 mg/dL — ABNORMAL HIGH (ref 65–99)
Potassium: 4.1 mmol/L (ref 3.5–5.1)
Sodium: 136 mmol/L (ref 135–145)

## 2016-08-22 LAB — URINE CULTURE: Culture: NO GROWTH

## 2016-08-22 MED ORDER — FAMOTIDINE 20 MG PO TABS
20.0000 mg | ORAL_TABLET | Freq: Every day | ORAL | Status: DC
  Administered 2016-08-22 – 2016-08-30 (×9): 20 mg via ORAL
  Filled 2016-08-22 (×9): qty 1

## 2016-08-22 NOTE — Progress Notes (Signed)
Subjective:   He is doing relatively well. He does have some incisional pain but no nausea or vomiting. He does note some abdominal bowel sounds. His laboratory evaluation showed decrease in his white blood cell count. He has some moderate drainage.  Vital signs in last 24 hours: Temp:  [98.2 F (36.8 C)-98.7 F (37.1 C)] 98.7 F (37.1 C) (11/02 1410) Pulse Rate:  [76-89] 89 (11/02 1410) Resp:  [18-20] 20 (11/02 1410) BP: (124-138)/(76-90) 138/84 (11/02 1410) SpO2:  [92 %-94 %] 94 % (11/02 1410)    Intake/Output from previous day: 11/01 0701 - 11/02 0700 In: 6333.7 [I.V.:3036.7; IV Piggyback:3247.1] Out: 1530 [Urine:1150; Drains:180; Blood:200]  Exam:  He appears a bit flushed but is otherwise only complaining of incisional pain. He does have some moderate drainage.  Lab Results:  CBC  Recent Labs  08/21/16 0719 08/22/16 0437  WBC 23.1* 16.7*  HGB 17.7 14.2  HCT 52.9* 41.1  PLT 279 194   CMP     Component Value Date/Time   NA 136 08/22/2016 0437   K 4.1 08/22/2016 0437   CL 103 08/22/2016 0437   CO2 28 08/22/2016 0437   GLUCOSE 142 (H) 08/22/2016 0437   BUN 21 (H) 08/22/2016 0437   CREATININE 1.21 08/22/2016 0437   CALCIUM 7.9 (L) 08/22/2016 0437   PROT 7.8 08/21/2016 0719   ALBUMIN 4.0 08/21/2016 0719   AST 31 08/21/2016 0719   ALT 29 08/21/2016 0719   ALKPHOS 55 08/21/2016 0719   BILITOT 3.4 (H) 08/21/2016 0719   GFRNONAA >60 08/22/2016 0437   GFRAA >60 08/22/2016 0437   PT/INR No results for input(s): LABPROT, INR in the last 72 hours.  Studies/Results: Ct Abdomen Pelvis W Contrast  Result Date: 08/21/2016 CLINICAL DATA:  RIGHT lower quadrant RIGHT flank pain with hematuria and vomiting for 2 days, history of kidney stones EXAM: CT ABDOMEN AND PELVIS WITH CONTRAST TECHNIQUE: Multidetector CT imaging of the abdomen and pelvis was performed using the standard protocol following bolus administration of intravenous contrast. Sagittal and coronal MPR images  reconstructed from axial data set. CONTRAST:  100mL ISOVUE-300 IOPAMIDOL (ISOVUE-300) INJECTION 61% IV. Dilute oral contrast. COMPARISON:  None FINDINGS: Lower chest: Subsegmental atelectasis BILATERAL lower lobes greater on RIGHT Hepatobiliary: Gallbladder and liver normal appearance Pancreas: Normal appearance Spleen: Normal appearance Adrenals/Urinary Tract: Adrenal glands normal appearance. Small cyst upper pole LEFT kidney 1.6 x 1.4 cm image 31. Kidneys, ureters, and bladder otherwise normal appearance. Stomach/Bowel: Dilated thickened appendix with extensive periappendiceal inflammatory changes compatible with acute appendicitis. Free fluid at RIGHT pericolic gutter and dependently in pelvis. Free intraperitoneal air compatible with perforation. Mild wall thickening of cecal tip. No evidence of bowel obstruction. Few sigmoid diverticula, uncomplicated. Stomach and bowel loops otherwise normal appearance. Vascular/Lymphatic: Aorta normal caliber.  No adenopathy. Reproductive: N/A Other: Tiny umbilical hernia containing fat. Musculoskeletal: Unremarkable IMPRESSION: Acute appendicitis with extensive periappendiceal inflammatory changes and free intraperitoneal air/fluid consistent with perforation. Bibasilar atelectasis greater on RIGHT. Findings called to Dr.  Shaune PollackLord on 08/21/2016 at 0937 hours. Electronically Signed   By: Ulyses SouthwardMark  Boles M.D.   On: 08/21/2016 09:38    Assessment/Plan: Overall he seems to be doing quite well. I'm pleased with his progress. We will increase his activity is started on liquid diet and switching to oral antibiotics and he continues to do well.

## 2016-08-23 LAB — SURGICAL PATHOLOGY

## 2016-08-23 LAB — BASIC METABOLIC PANEL
ANION GAP: 7 (ref 5–15)
BUN: 21 mg/dL — ABNORMAL HIGH (ref 6–20)
CALCIUM: 7.5 mg/dL — AB (ref 8.9–10.3)
CO2: 26 mmol/L (ref 22–32)
CREATININE: 1.08 mg/dL (ref 0.61–1.24)
Chloride: 101 mmol/L (ref 101–111)
GFR calc non Af Amer: >60 mL/min (ref >60)
Glucose, Bld: 125 mg/dL — ABNORMAL HIGH (ref 65–99)
Potassium: 3.8 mmol/L (ref 3.5–5.1)
SODIUM: 134 mmol/L — AB (ref 135–145)

## 2016-08-23 LAB — CBC
HEMATOCRIT: 42.7 % (ref 40.0–52.0)
HEMOGLOBIN: 14.2 g/dL (ref 13.0–18.0)
MCH: 28.7 pg (ref 26.0–34.0)
MCHC: 33.3 g/dL (ref 32.0–36.0)
MCV: 86.1 fL (ref 80.0–100.0)
Platelets: 237 10*3/uL (ref 150–440)
RBC: 4.96 MIL/uL (ref 4.40–5.90)
RDW: 12.9 % (ref 11.5–14.5)
WBC: 13.7 10*3/uL — AB (ref 3.8–10.6)

## 2016-08-23 NOTE — Progress Notes (Signed)
Subjective:   He continues to improve. He has some mild abdominal incisional discomfort but no fever and no nausea. He does feel a bit bloated. He has tolerated some clear liquids. He is noted some bowel sounds but has not had any flatus or bowel movement as yet. His white blood cell count is down to 13,000. His hemoglobin stable.  Vital signs in last 24 hours: Temp:  [98.3 F (36.8 C)-98.8 F (37.1 C)] 98.3 F (36.8 C) (11/03 0745) Pulse Rate:  [74-89] 74 (11/03 0745) Resp:  [17-20] 18 (11/03 0745) BP: (126-138)/(79-84) 129/84 (11/03 0745) SpO2:  [94 %-95 %] 94 % (11/03 0745) Last BM Date: 08/19/16  Intake/Output from previous day: 11/02 0701 - 11/03 0700 In: 3319.5 [P.O.:300; I.V.:2419.5; IV Piggyback:600] Out: 1590 [Urine:1550; Drains:40]  Exam:  His wound looks good. Dressing was changed. There is minimal drainage on the JP drain.  Lab Results:  CBC  Recent Labs  08/22/16 0437 08/23/16 0424  WBC 16.7* 13.7*  HGB 14.2 14.2  HCT 41.1 42.7  PLT 194 237   CMP     Component Value Date/Time   NA 134 (L) 08/23/2016 0424   K 3.8 08/23/2016 0424   CL 101 08/23/2016 0424   CO2 26 08/23/2016 0424   GLUCOSE 125 (H) 08/23/2016 0424   BUN 21 (H) 08/23/2016 0424   CREATININE 1.08 08/23/2016 0424   CALCIUM 7.5 (L) 08/23/2016 0424   PROT 7.8 08/21/2016 0719   ALBUMIN 4.0 08/21/2016 0719   AST 31 08/21/2016 0719   ALT 29 08/21/2016 0719   ALKPHOS 55 08/21/2016 0719   BILITOT 3.4 (H) 08/21/2016 0719   GFRNONAA >60 08/23/2016 0424   GFRAA >60 08/23/2016 0424   PT/INR No results for input(s): LABPROT, INR in the last 72 hours.  Studies/Results: Ct Abdomen Pelvis W Contrast  Result Date: 08/21/2016 CLINICAL DATA:  RIGHT lower quadrant RIGHT flank pain with hematuria and vomiting for 2 days, history of kidney stones EXAM: CT ABDOMEN AND PELVIS WITH CONTRAST TECHNIQUE: Multidetector CT imaging of the abdomen and pelvis was performed using the standard protocol following bolus  administration of intravenous contrast. Sagittal and coronal MPR images reconstructed from axial data set. CONTRAST:  100mL ISOVUE-300 IOPAMIDOL (ISOVUE-300) INJECTION 61% IV. Dilute oral contrast. COMPARISON:  None FINDINGS: Lower chest: Subsegmental atelectasis BILATERAL lower lobes greater on RIGHT Hepatobiliary: Gallbladder and liver normal appearance Pancreas: Normal appearance Spleen: Normal appearance Adrenals/Urinary Tract: Adrenal glands normal appearance. Small cyst upper pole LEFT kidney 1.6 x 1.4 cm image 31. Kidneys, ureters, and bladder otherwise normal appearance. Stomach/Bowel: Dilated thickened appendix with extensive periappendiceal inflammatory changes compatible with acute appendicitis. Free fluid at RIGHT pericolic gutter and dependently in pelvis. Free intraperitoneal air compatible with perforation. Mild wall thickening of cecal tip. No evidence of bowel obstruction. Few sigmoid diverticula, uncomplicated. Stomach and bowel loops otherwise normal appearance. Vascular/Lymphatic: Aorta normal caliber.  No adenopathy. Reproductive: N/A Other: Tiny umbilical hernia containing fat. Musculoskeletal: Unremarkable IMPRESSION: Acute appendicitis with extensive periappendiceal inflammatory changes and free intraperitoneal air/fluid consistent with perforation. Bibasilar atelectasis greater on RIGHT. Findings called to Dr.  Shaune PollackLord on 08/21/2016 at 0937 hours. Electronically Signed   By: Ulyses SouthwardMark  Boles M.D.   On: 08/21/2016 09:38    Assessment/Plan: He slowly improving. We will continue his IV antibiotics. We will slowly advance his diet and activity level. I discussed the fact likely be with us for couple more days in the hospital.

## 2016-08-24 LAB — BASIC METABOLIC PANEL
Anion gap: 4 — ABNORMAL LOW (ref 5–15)
BUN: 15 mg/dL (ref 6–20)
CHLORIDE: 103 mmol/L (ref 101–111)
CO2: 26 mmol/L (ref 22–32)
CREATININE: 0.9 mg/dL (ref 0.61–1.24)
Calcium: 7.6 mg/dL — ABNORMAL LOW (ref 8.9–10.3)
GFR calc non Af Amer: >60 mL/min (ref >60)
GLUCOSE: 123 mg/dL — AB (ref 65–99)
Potassium: 3.8 mmol/L (ref 3.5–5.1)
Sodium: 133 mmol/L — ABNORMAL LOW (ref 135–145)

## 2016-08-24 LAB — CBC
HEMATOCRIT: 43.5 % (ref 40.0–52.0)
HEMOGLOBIN: 14.6 g/dL (ref 13.0–18.0)
MCH: 28.9 pg (ref 26.0–34.0)
MCHC: 33.5 g/dL (ref 32.0–36.0)
MCV: 86.3 fL (ref 80.0–100.0)
Platelets: 255 10*3/uL (ref 150–440)
RBC: 5.04 MIL/uL (ref 4.40–5.90)
RDW: 12.9 % (ref 11.5–14.5)
WBC: 12.1 10*3/uL — ABNORMAL HIGH (ref 3.8–10.6)

## 2016-08-24 NOTE — Progress Notes (Signed)
Subjective:   He is up sitting in a chair with no particular complaints this morning. He has been ambulating without difficulty. He is passing some gas. Had a bowel movement as yet. He does not budge appetite but is not nauseated and has not vomited. He is tolerating liquid diet. His lab work is not available this morning as yet.  Vital signs in last 24 hours: Temp:  [98.4 F (36.9 C)-98.9 F (37.2 C)] 98.4 F (36.9 C) (11/04 0546) Pulse Rate:  [76-86] 86 (11/04 0546) Resp:  [18] 18 (11/04 0546) BP: (121-131)/(78-82) 131/78 (11/04 0546) SpO2:  [93 %-94 %] 94 % (11/04 0546) Last BM Date: 08/19/16  Intake/Output from previous day: 11/03 0701 - 11/04 0700 In: 3497 [P.O.:990; I.V.:2507] Out: 1910 [Urine:1900; Drains:10]  Exam:  His abdomen looks good. His wounds are clean with minimal drainage from the [place]. He also has serosanguineous to goal drainage from his JP. His abdomen is soft with mild incisional tenderness.  Lab Results:  CBC  Recent Labs  08/22/16 0437 08/23/16 0424  WBC 16.7* 13.7*  HGB 14.2 14.2  HCT 41.1 42.7  PLT 194 237   CMP     Component Value Date/Time   NA 134 (L) 08/23/2016 0424   K 3.8 08/23/2016 0424   CL 101 08/23/2016 0424   CO2 26 08/23/2016 0424   GLUCOSE 125 (H) 08/23/2016 0424   BUN 21 (H) 08/23/2016 0424   CREATININE 1.08 08/23/2016 0424   CALCIUM 7.5 (L) 08/23/2016 0424   PROT 7.8 08/21/2016 0719   ALBUMIN 4.0 08/21/2016 0719   AST 31 08/21/2016 0719   ALT 29 08/21/2016 0719   ALKPHOS 55 08/21/2016 0719   BILITOT 3.4 (H) 08/21/2016 0719   GFRNONAA >60 08/23/2016 0424   GFRAA >60 08/23/2016 0424   PT/INR No results for input(s): LABPROT, INR in the last 72 hours.  Studies/Results: No results found.  Assessment/Plan: He is doing well. We will repeat his laboratory values again tomorrow and continue his IV antibiotics. We'll slowly advance his diet and his activity level.

## 2016-08-25 LAB — CBC
HEMATOCRIT: 41.4 % (ref 40.0–52.0)
Hemoglobin: 14 g/dL (ref 13.0–18.0)
MCH: 28.9 pg (ref 26.0–34.0)
MCHC: 33.7 g/dL (ref 32.0–36.0)
MCV: 85.8 fL (ref 80.0–100.0)
Platelets: 256 10*3/uL (ref 150–440)
RBC: 4.82 MIL/uL (ref 4.40–5.90)
RDW: 13 % (ref 11.5–14.5)
WBC: 11.9 10*3/uL — AB (ref 3.8–10.6)

## 2016-08-25 MED ORDER — CIPROFLOXACIN HCL 500 MG PO TABS
500.0000 mg | ORAL_TABLET | Freq: Two times a day (BID) | ORAL | Status: DC
  Administered 2016-08-25 – 2016-08-26 (×4): 500 mg via ORAL
  Filled 2016-08-25 (×6): qty 1

## 2016-08-25 MED ORDER — METRONIDAZOLE 500 MG PO TABS
500.0000 mg | ORAL_TABLET | Freq: Three times a day (TID) | ORAL | Status: DC
  Administered 2016-08-25 – 2016-08-28 (×11): 500 mg via ORAL
  Filled 2016-08-25 (×11): qty 1

## 2016-08-25 NOTE — Progress Notes (Signed)
Subjective:   He feels better today. He is getting bored double anxious. He had no fever or chills. He has moderate incisional pain. His white blood cell count is down to 12,000. He's tolerating a soft diet.  Vital signs in last 24 hours: Temp:  [98.6 F (37 C)-99 F (37.2 C)] 98.9 F (37.2 C) (11/05 0508) Pulse Rate:  [83-88] 83 (11/05 0508) Resp:  [17-18] 17 (11/05 0508) BP: (124-137)/(76-79) 124/78 (11/05 0508) SpO2:  [93 %-94 %] 94 % (11/05 0508) Last BM Date: 08/24/16  Intake/Output from previous day: 11/04 0701 - 11/05 0700 In: 2233.3 [P.O.:480; I.V.:1543.3; IV Piggyback:200] Out: 2210 [Urine:2200; Drains:10]  Exam:  His abdomen is benign other than the incisional pain. No significant wound problems. His JP drain is putting out serosanguineous material but very small amounts.  Lab Results:  CBC  Recent Labs  08/24/16 0830 08/25/16 0508  WBC 12.1* 11.9*  HGB 14.6 14.0  HCT 43.5 41.4  PLT 255 256   CMP     Component Value Date/Time   NA 133 (L) 08/24/2016 0830   K 3.8 08/24/2016 0830   CL 103 08/24/2016 0830   CO2 26 08/24/2016 0830   GLUCOSE 123 (H) 08/24/2016 0830   BUN 15 08/24/2016 0830   CREATININE 0.90 08/24/2016 0830   CALCIUM 7.6 (L) 08/24/2016 0830   PROT 7.8 08/21/2016 0719   ALBUMIN 4.0 08/21/2016 0719   AST 31 08/21/2016 0719   ALT 29 08/21/2016 0719   ALKPHOS 55 08/21/2016 0719   BILITOT 3.4 (H) 08/21/2016 0719   GFRNONAA >60 08/24/2016 0830   GFRAA >60 08/24/2016 0830   PT/INR No results for input(s): LABPROT, INR in the last 72 hours.  Studies/Results: No results found.  Assessment/Plan: We will switch him to oral antibiotics increase his activity level. Hopefully we'll be able to discharge him with the next 24 hours. This plan was discussed with him in detail and he is in agreement

## 2016-08-25 NOTE — Progress Notes (Signed)
I note his admission bilirubin was 3.4. CT scan did not demonstrate any any hepatobiliary abnormalities. He does not appear clinically jaundiced. We will repeat his liver panel tomorrow morning for evidence of resolution.

## 2016-08-26 ENCOUNTER — Inpatient Hospital Stay: Payer: BC Managed Care – PPO

## 2016-08-26 ENCOUNTER — Encounter: Payer: Self-pay | Admitting: Radiology

## 2016-08-26 LAB — CBC
HCT: 40.3 % (ref 40.0–52.0)
Hemoglobin: 13.9 g/dL (ref 13.0–18.0)
MCH: 29.3 pg (ref 26.0–34.0)
MCHC: 34.6 g/dL (ref 32.0–36.0)
MCV: 84.6 fL (ref 80.0–100.0)
PLATELETS: 268 10*3/uL (ref 150–440)
RBC: 4.76 MIL/uL (ref 4.40–5.90)
RDW: 12.8 % (ref 11.5–14.5)
WBC: 13.5 10*3/uL — ABNORMAL HIGH (ref 3.8–10.6)

## 2016-08-26 LAB — CULTURE, BLOOD (ROUTINE X 2)
CULTURE: NO GROWTH
Culture: NO GROWTH

## 2016-08-26 LAB — HEPATIC FUNCTION PANEL
ALT: 32 U/L (ref 17–63)
AST: 34 U/L (ref 15–41)
Albumin: 2.4 g/dL — ABNORMAL LOW (ref 3.5–5.0)
Alkaline Phosphatase: 105 U/L (ref 38–126)
BILIRUBIN DIRECT: 0.6 mg/dL — AB (ref 0.1–0.5)
BILIRUBIN TOTAL: 1.6 mg/dL — AB (ref 0.3–1.2)
Indirect Bilirubin: 1 mg/dL — ABNORMAL HIGH (ref 0.3–0.9)
Total Protein: 5.7 g/dL — ABNORMAL LOW (ref 6.5–8.1)

## 2016-08-26 MED ORDER — IOPAMIDOL (ISOVUE-300) INJECTION 61%
100.0000 mL | Freq: Once | INTRAVENOUS | Status: AC | PRN
Start: 2016-08-26 — End: 2016-08-26
  Administered 2016-08-26: 100 mL via INTRAVENOUS

## 2016-08-26 MED ORDER — SODIUM CHLORIDE 0.9 % IV SOLN
INTRAVENOUS | Status: DC
  Administered 2016-08-27: 10 mL/h via INTRAVENOUS

## 2016-08-26 MED ORDER — IOPAMIDOL (ISOVUE-300) INJECTION 61%
15.0000 mL | INTRAVENOUS | Status: AC
  Administered 2016-08-26 (×2): 15 mL via ORAL

## 2016-08-26 NOTE — Progress Notes (Signed)
60 year old male who had a ruptured acute appendicitis POD#5 from open appendectomy with drain placement. Patient has been up and moving around well in his room. He has been tolerating a regular diet. He has been having multiple bouts of diarrhea. Patient states his pain is well-controlled.  Vitals:   08/26/16 0510 08/26/16 1300  BP: 125/77 124/72  Pulse: 79 77  Resp: 18 19  Temp: 98.2 F (36.8 C) 98 F (36.7 C)   I/O last 3 completed shifts: In: 2225.8 [P.O.:180; I.V.:2045.8] Out: 1955 [Urine:1525; Drains:430] No intake/output data recorded.   PE:  Gen: NAD Abd: soft, appropriately tender, incision c/d/i, jp drain serous Ext: 2+ pulses, no edema   CBC Latest Ref Rng & Units 08/26/2016 08/25/2016 08/24/2016  WBC 3.8 - 10.6 K/uL 13.5(H) 11.9(H) 12.1(H)  Hemoglobin 13.0 - 18.0 g/dL 40.913.9 81.114.0 91.414.6  Hematocrit 40.0 - 52.0 % 40.3 41.4 43.5  Platelets 150 - 440 K/uL 268 256 255   CMP Latest Ref Rng & Units 08/26/2016 08/24/2016 08/23/2016  Glucose 65 - 99 mg/dL - 782(N123(H) 562(Z125(H)  BUN 6 - 20 mg/dL - 15 30(Q21(H)  Creatinine 0.61 - 1.24 mg/dL - 6.570.90 8.461.08  Sodium 962135 - 145 mmol/L - 133(L) 134(L)  Potassium 3.5 - 5.1 mmol/L - 3.8 3.8  Chloride 101 - 111 mmol/L - 103 101  CO2 22 - 32 mmol/L - 26 26  Calcium 8.9 - 10.3 mg/dL - 7.6(L) 7.5(L)  Total Protein 6.5 - 8.1 g/dL 9.5(M5.7(L) - -  Total Bilirubin 0.3 - 1.2 mg/dL 8.4(X1.6(H) - -  Alkaline Phos 38 - 126 U/L 105 - -  AST 15 - 41 U/L 34 - -  ALT 17 - 63 U/L 32 - -   A/P:  60 year old male who had a ruptured acute appendicitis POD#5 from open appendectomy with drain placement.  Pain: Norco and IV prn GI: regular diet, CT scan today due to elevated WBC showed fluid collection in pelvis, Discussed with radiology and they will attempt to place the CT-guided drain in this collection tomorrow, continue antibiotics Encourage ambulation

## 2016-08-27 LAB — CBC
HEMATOCRIT: 40.9 % (ref 40.0–52.0)
HEMOGLOBIN: 13.7 g/dL (ref 13.0–18.0)
MCH: 28.8 pg (ref 26.0–34.0)
MCHC: 33.5 g/dL (ref 32.0–36.0)
MCV: 85.8 fL (ref 80.0–100.0)
Platelets: 318 10*3/uL (ref 150–440)
RBC: 4.77 MIL/uL (ref 4.40–5.90)
RDW: 13 % (ref 11.5–14.5)
WBC: 13.6 10*3/uL — ABNORMAL HIGH (ref 3.8–10.6)

## 2016-08-27 LAB — COMPREHENSIVE METABOLIC PANEL
ALK PHOS: 106 U/L (ref 38–126)
ALT: 34 U/L (ref 17–63)
AST: 38 U/L (ref 15–41)
Albumin: 2.5 g/dL — ABNORMAL LOW (ref 3.5–5.0)
Anion gap: 5 (ref 5–15)
BILIRUBIN TOTAL: 0.8 mg/dL (ref 0.3–1.2)
BUN: 13 mg/dL (ref 6–20)
CALCIUM: 7.7 mg/dL — AB (ref 8.9–10.3)
CO2: 25 mmol/L (ref 22–32)
CREATININE: 0.89 mg/dL (ref 0.61–1.24)
Chloride: 105 mmol/L (ref 101–111)
Glucose, Bld: 118 mg/dL — ABNORMAL HIGH (ref 65–99)
Potassium: 4.1 mmol/L (ref 3.5–5.1)
Sodium: 135 mmol/L (ref 135–145)
Total Protein: 5.7 g/dL — ABNORMAL LOW (ref 6.5–8.1)

## 2016-08-27 LAB — APTT: aPTT: 28 seconds (ref 24–36)

## 2016-08-27 LAB — PROTIME-INR
INR: 1.14
Prothrombin Time: 14.7 seconds (ref 11.4–15.2)

## 2016-08-27 MED ORDER — AMOXICILLIN-POT CLAVULANATE 875-125 MG PO TABS
1.0000 | ORAL_TABLET | Freq: Two times a day (BID) | ORAL | Status: DC
  Administered 2016-08-27 – 2016-08-28 (×3): 1 via ORAL
  Filled 2016-08-27 (×3): qty 1

## 2016-08-27 NOTE — Progress Notes (Signed)
60 year old male who had a ruptured acute appendicitis POD#6 from open appendectomy with drain placement. Patient has been up and moving around well in his room. He has been tolerating a regular diet. He has been having some diarrhea but has slowed down some today as well.  Patient states his pain is well-controlled.  Vitals:   08/27/16 0449 08/27/16 1209  BP: 124/81 125/77  Pulse: 84 81  Resp: 16 18  Temp: 98.5 F (36.9 C) 98.1 F (36.7 C)   I/O last 3 completed shifts: In: 1310 [P.O.:180; I.V.:1130] Out: 1290 [Urine:775; Drains:515] Total I/O In: 527 [I.V.:527] Out: 700 [Urine:575; Drains:125]   PE:  Gen: NAD Abd: soft, appropriately tender, incision c/d/i, jp drain serous Ext: 2+ pulses, no edema   CBC Latest Ref Rng & Units 08/27/2016 08/26/2016 08/25/2016  WBC 3.8 - 10.6 K/uL 13.6(H) 13.5(H) 11.9(H)  Hemoglobin 13.0 - 18.0 g/dL 78.413.7 69.613.9 29.514.0  Hematocrit 40.0 - 52.0 % 40.9 40.3 41.4  Platelets 150 - 440 K/uL 318 268 256   CMP Latest Ref Rng & Units 08/27/2016 08/26/2016 08/24/2016  Glucose 65 - 99 mg/dL 284(X118(H) - 324(M123(H)  BUN 6 - 20 mg/dL 13 - 15  Creatinine 0.100.61 - 1.24 mg/dL 2.720.89 - 5.360.90  Sodium 644135 - 145 mmol/L 135 - 133(L)  Potassium 3.5 - 5.1 mmol/L 4.1 - 3.8  Chloride 101 - 111 mmol/L 105 - 103  CO2 22 - 32 mmol/L 25 - 26  Calcium 8.9 - 10.3 mg/dL 7.7(L) - 7.6(L)  Total Protein 6.5 - 8.1 g/dL 0.3(K5.7(L) 5.7(L) -  Total Bilirubin 0.3 - 1.2 mg/dL 0.8 7.4(Q1.6(H) -  Alkaline Phos 38 - 126 U/L 106 105 -  AST 15 - 41 U/L 38 34 -  ALT 17 - 63 U/L 34 32 -   A/P:  60 year old male who had a ruptured acute appendicitis POD#6 from open appendectomy with drain placement.  Pain: Norco and IV prn GI: regular diet, discussed with Dr. Fredia SorrowYamagata of radiology this AM who felt that draining the fluid would not be advisable at this time, I have changed the cipro to Augmentin and continued the flagyl, will check CBC in AM, if down will likely dc tomorrow, if increased will speak with radiology  again for drain placement Encourage ambulation

## 2016-08-27 NOTE — Progress Notes (Signed)
Notified that patient will not be having drain placed today. Okay per Dr. Orvis BrillLoflin to place patient on a regular diet. Also okay per Dr. Orvis BrillLoflin to change antibiotic from cipro to augmentin 875 oral. Patient has a allergy to penicillins with joint swelling as a child. Patient stated that he has probably taken augmentin or amoxicillin in the past and doesn't remember having any problems.

## 2016-08-27 NOTE — Progress Notes (Addendum)
Patient ID: Andrew AlmondKevin Hopkins, male   DOB: 02-06-1956, 60 y.o.   MRN: [id number]  Intervenitonal Radiology:  CT from yesterday reviewed in anticipation of possible CT guided drainage procedure.  Tiny amount of fluid medial to cecum is not accessible percutaneously to aspiration or drain placement due to overlying ileum anteriorly.  Free fluid inferior to cecum and in pelvis does not appear to be marginated or enhancing to suggest obvious abscess by imaging at this point.  Fluid in posterior deep pelvis actually looks improved since 11/1.  Only role for perc drainage would be if continued increasing WBC and repeat CT showing accessible marginated fluid collection/abscess.  Discussed with Dr. Orvis BrillLoflin; will hold on drainage procedure today.  Recommend repeat CT w/ IV contrast if continued trend of increasing white count.  Jodi MarbleGlenn T. Fredia SorrowYamagata, M.D Pager:  [phone]

## 2016-08-28 ENCOUNTER — Encounter: Payer: Self-pay | Admitting: Radiology

## 2016-08-28 ENCOUNTER — Inpatient Hospital Stay: Payer: BC Managed Care – PPO

## 2016-08-28 LAB — CBC
HEMATOCRIT: 44.1 % (ref 40.0–52.0)
Hemoglobin: 15.1 g/dL (ref 13.0–18.0)
MCH: 29 pg (ref 26.0–34.0)
MCHC: 34.1 g/dL (ref 32.0–36.0)
MCV: 85 fL (ref 80.0–100.0)
PLATELETS: 412 10*3/uL (ref 150–440)
RBC: 5.19 MIL/uL (ref 4.40–5.90)
RDW: 12.9 % (ref 11.5–14.5)
WBC: 16.6 10*3/uL — AB (ref 3.8–10.6)

## 2016-08-28 MED ORDER — PIPERACILLIN-TAZOBACTAM 3.375 G IVPB
3.3750 g | Freq: Three times a day (TID) | INTRAVENOUS | Status: DC
  Administered 2016-08-28 – 2016-08-30 (×6): 3.375 g via INTRAVENOUS
  Filled 2016-08-28 (×14): qty 50

## 2016-08-28 MED ORDER — IOPAMIDOL (ISOVUE-300) INJECTION 61%
100.0000 mL | Freq: Once | INTRAVENOUS | Status: AC | PRN
  Administered 2016-08-28: 100 mL via INTRAVENOUS

## 2016-08-28 NOTE — Progress Notes (Signed)
Pt alert and sitting in recliner, hopes for discharge today. CH is available.   08/28/16 0930  Clinical Encounter Type  Visited With Patient  Visit Type Initial  Referral From Nurse  Spiritual Encounters  Spiritual Needs Emotional  Stress Factors  Patient Stress Factors None identified

## 2016-08-28 NOTE — Progress Notes (Deleted)
Andrew Hopkins's CT continues to show decrease in fluid in both pelvis and right lower quadrant.  All labs seem somewhat elevated form previous day raising question of some dehydration at time of labs draw.  No drain indicated at this time.

## 2016-08-28 NOTE — Progress Notes (Signed)
08/28/2016  Subjective: Patient is 7 Days Post-Op s/p open appendectomy with drain placement.  No acute events overnight.  This morning, the patient's WBC was elevated at 16.6 compared to 13.6 yesterday.  Repeat CT scan today has shown actual improvement in the fluid collections.  No new drains have been required.  Vital signs: Temp:  [98 F (36.7 C)-98.3 F (36.8 C)] 98.3 F (36.8 C) (11/08 2007) Pulse Rate:  [81-94] 94 (11/08 2007) Resp:  [17-19] 17 (11/08 2007) BP: (121-126)/(73-76) 126/73 (11/08 2007) SpO2:  [95 %-96 %] 95 % (11/08 2007)   Intake/Output: 11/07 0701 - 11/08 0700 In: 1256 [P.O.:120; I.V.:1136] Out: 1660 [Urine:1475; Drains:185] Last BM Date: 08/28/16  Physical Exam: Constitutional:  No acute distress Abdomen:  Soft, non-distended, appropriately tender to palpation over incision.  Incision is clean, dry, intact.  JP with serous fluid.  Labs:   Recent Labs  08/27/16 0441 08/28/16 0436  WBC 13.6* 16.6*  HGB 13.7 15.1  HCT 40.9 44.1  PLT 318 412    Recent Labs  08/27/16 0441  NA 135  K 4.1  CL 105  CO2 25  GLUCOSE 118*  BUN 13  CREATININE 0.89  CALCIUM 7.7*    Recent Labs  08/27/16 0441  LABPROT 14.7  INR 1.14    Imaging: Ct Pelvis W Contrast  Result Date: 08/28/2016 CLINICAL DATA:  Status post appendectomy with persistent elevated white blood cell count EXAM: CT PELVIS WITH CONTRAST TECHNIQUE: Multidetector CT imaging of the pelvis was performed using the standard protocol following the bolus administration of intravenous contrast. CONTRAST:  100mL ISOVUE-300 IOPAMIDOL (ISOVUE-300) INJECTION 61% COMPARISON:  08/21/2016, 08/26/2016 FINDINGS: Urinary Tract: Bladder is well distended. Visualized portions of the kidneys are within normal limits. Bowel: The colon is again well visualized with changes of diverticulosis. No definitive diverticulitis is seen. There are changes consistent with a prior history of appendectomy. The fluid collection in  the right lower quadrant just below the surgical drain has improved significantly and only demonstrates a minimal residual amount of fluid. The previously described collection medial to the cecum has resolved as well. Vascular/Lymphatic: No pathologically enlarged lymph nodes. No significant vascular abnormality seen. Reproductive:  Prostate is within normal limits. Other: The free pelvic fluid has decreased in amount and size when compared with the prior exam. Some persistent inflammatory changes are noted in the pelvis. The surgical drain is again seen and stable. Musculoskeletal: Stable from the prior exam. IMPRESSION: Improved fluid collections both in the right lower quadrant and medial to the cecum as described above. The degree of free fluid within the pelvis continues to decrease from the prior exam. Continued follow-up is recommended. No percutaneous drain will be performed at this time. Electronically Signed   By: Alcide CleverMark  Lukens M.D.   On: 08/28/2016 14:24    Assessment/Plan: 60 yo male s/p open appendectomy.  --Despite elevated WBC this morning, CT scan actually showing improvement in fluid collections.  Continue regular diet today and will repeat CBC tomorrow. --Continue JP drain   Howie IllJose Luis Jayzen Paver, MD [facility]

## 2016-08-28 NOTE — Progress Notes (Signed)
Andrew Hopkins's CT continues to show decrease in fluid in both pelvis and right lower quadrant.  All labs seem somewhat elevated from previous day raising question of some dehydration at time of labs draw.  No drain indicated at this time.

## 2016-08-29 LAB — CBC WITH DIFFERENTIAL/PLATELET
BASOS ABS: 0.1 10*3/uL (ref 0–0.1)
BASOS PCT: 0 %
EOS ABS: 0.2 10*3/uL (ref 0–0.7)
Eosinophils Relative: 1 %
HCT: 39.5 % — ABNORMAL LOW (ref 40.0–52.0)
HEMOGLOBIN: 13.8 g/dL (ref 13.0–18.0)
LYMPHS ABS: 1.4 10*3/uL (ref 1.0–3.6)
Lymphocytes Relative: 9 %
MCH: 29.3 pg (ref 26.0–34.0)
MCHC: 35 g/dL (ref 32.0–36.0)
MCV: 83.7 fL (ref 80.0–100.0)
Monocytes Absolute: 1.9 10*3/uL — ABNORMAL HIGH (ref 0.2–1.0)
Monocytes Relative: 12 %
NEUTROS PCT: 78 %
Neutro Abs: 13.2 10*3/uL — ABNORMAL HIGH (ref 1.4–6.5)
PLATELETS: 344 10*3/uL (ref 150–440)
RBC: 4.71 MIL/uL (ref 4.40–5.90)
RDW: 12.8 % (ref 11.5–14.5)
WBC: 16.8 10*3/uL — AB (ref 3.8–10.6)

## 2016-08-29 NOTE — Progress Notes (Signed)
60 year old male who had a ruptured acute appendicitis POD#8 from open appendectomy with drain placement. Patient has been up and moving around well in his room. He has been tolerating a regular diet. His diarrhea has improved.    Vitals:   08/28/16 2007 08/29/16 0504  BP: 126/73 121/70  Pulse: 94 79  Resp: 17 (!) 22  Temp: 98.3 F (36.8 C) 98.8 F (37.1 C)   I/O last 3 completed shifts: In: 3471 [P.O.:300; I.V.:2971; IV Piggyback:200] Out: 1910 [Urine:1775; Drains:135] No intake/output data recorded.   PE:  Gen: NAD Abd: soft, appropriately tender, incision c/d/i, jp drain serous Ext: 2+ pulses, no edema   CBC Latest Ref Rng & Units 08/29/2016 08/28/2016 08/27/2016  WBC 3.8 - 10.6 K/uL 16.8(H) 16.6(H) 13.6(H)  Hemoglobin 13.0 - 18.0 g/dL 21.313.8 08.615.1 57.813.7  Hematocrit 40.0 - 52.0 % 39.5(L) 44.1 40.9  Platelets 150 - 440 K/uL 344 412 318   CMP Latest Ref Rng & Units 08/27/2016 08/26/2016 08/24/2016  Glucose 65 - 99 mg/dL 469(G118(H) - 295(M123(H)  BUN 6 - 20 mg/dL 13 - 15  Creatinine 8.410.61 - 1.24 mg/dL 3.240.89 - 4.010.90  Sodium 027135 - 145 mmol/L 135 - 133(L)  Potassium 3.5 - 5.1 mmol/L 4.1 - 3.8  Chloride 101 - 111 mmol/L 105 - 103  CO2 22 - 32 mmol/L 25 - 26  Calcium 8.9 - 10.3 mg/dL 7.7(L) - 7.6(L)  Total Protein 6.5 - 8.1 g/dL 2.5(D5.7(L) 5.7(L) -  Total Bilirubin 0.3 - 1.2 mg/dL 0.8 6.6(Y1.6(H) -  Alkaline Phos 38 - 126 U/L 106 105 -  AST 15 - 41 U/L 38 34 -  ALT 17 - 63 U/L 34 32 -   CT scan: small abscess just anterior to rectum improving   A/P:  60 year old male who had a ruptured acute appendicitis POD#8 from open appendectomy with drain placement.  Pain: Norco and IV prn GI: regular diet, discussed radiology and they cannot drain this area, patient is clinically improving, will check WBC in AM and likely d/c home on oral antibiotics Encourage ambulation

## 2016-08-30 LAB — CBC
HCT: 38.7 % — ABNORMAL LOW (ref 40.0–52.0)
Hemoglobin: 13.2 g/dL (ref 13.0–18.0)
MCH: 28.8 pg (ref 26.0–34.0)
MCHC: 34 g/dL (ref 32.0–36.0)
MCV: 84.5 fL (ref 80.0–100.0)
Platelets: 362 10*3/uL (ref 150–440)
RBC: 4.57 MIL/uL (ref 4.40–5.90)
RDW: 12.7 % (ref 11.5–14.5)
WBC: 15 10*3/uL — ABNORMAL HIGH (ref 3.8–10.6)

## 2016-08-30 MED ORDER — AMOXICILLIN-POT CLAVULANATE 875-125 MG PO TABS: 1.0000 | ORAL_TABLET | Freq: Two times a day (BID) | ORAL | 0 refills | Status: DC

## 2016-08-30 MED ORDER — HYDROCODONE-ACETAMINOPHEN 5-325 MG PO TABS: 1.0000 | ORAL_TABLET | ORAL | 0 refills | Status: DC | PRN

## 2016-08-30 MED ORDER — METRONIDAZOLE 500 MG PO TABS: 500.0000 mg | ORAL_TABLET | Freq: Three times a day (TID) | ORAL | 0 refills | Status: DC

## 2016-08-30 NOTE — Progress Notes (Signed)
Pt A and O x 4. VSS. Pt tolerating diet well. No complaints of pain or nausea. IV removed intact, prescriptions given. JP drain removed. Pt voiced understanding of discharge instructions with no further questions. Pt discharged via wheelchair with nursing student.

## 2016-08-30 NOTE — Discharge Instructions (Signed)
Laparoscopic Appendectomy, Adult, Care After These instructions give you information on caring for yourself after your procedure. Your doctor may also give you more specific instructions. Call your doctor if you have any problems or questions after your procedure. HOME CARE  Do not drive while taking pain medicine (narcotics).  Take medicine (stool softener) if you cannot poop (constipated).  Change your bandages (dressings) as told by your doctor.  Keep your wounds clean and dry. Wash the wounds gently with soap and water. Gently pat the wounds dry with a clean towel.  Do not take baths, swim, or use hot tubs for 10 days, or as told by your doctor.  Only take medicine as told by your doctor.  Continue your normal diet as told by your doctor.  Do not lift more than 10 pounds (4.5 kilograms) or play contact sports for 3 weeks, or as told by your doctor.  Slowly increase your activity.  Take deep breaths to avoid a lung infection (pneumonia). GET HELP RIGHT AWAY IF:   You have a fever.  You have a rash.  You have trouble breathing or sharp chest pain.  You have a reaction to the medicine you are taking.  Your wound is red, puffy (swollen), or painful.  You have yellowish-white fluid (pus) coming from the wound.  You have fluid coming from the wound for longer than 1 day.  You notice a bad smell coming from the wound or bandage.  Your wound breaks open after stitches (sutures) or staples are removed.  You have pain in the shoulders or shoulder blades.  You feel dizzy or pass out (faint).  You are short of breath.  You feel sick to your stomach (nauseous) or throw up (vomit).  You cannot control when you poop, or you lose your appetite.  You have watery poop (diarrhea). MAKE SURE YOU:  Understand these instructions.  Will watch your condition.  Will get help right away if you are not doing well or get worse.   This information is not intended to replace  advice given to you by your health care provider. Make sure you discuss any questions you have with your health care provider.   Document Released: 08/03/2009 Document Revised: 10/28/2014 Document Reviewed: 03/27/2015 Elsevier Interactive Patient Education [employer]2016 [employer] Inc.

## 2016-08-31 NOTE — Discharge Summary (Signed)
Physician Discharge Summary  Patient ID: Andrew AlmondKevin Montalvo MRN: [id number] DOB/AGE: [birth year] 60 y.o.  Admit date: 08/21/2016 Discharge date: 08/31/2016  Admission Diagnoses:  Acute ruptured appendicitis   Discharge Diagnoses:  Active Problems:   Acute appendicitis with generalized peritonitis   Discharged Condition: good  Hospital Course: 60 year old male who had ruptured appendicitis and was taken to the OR for laparoscopic converted to open appendectomy on 11/ 1 with Dr. Michela PitcherEly.  Patient doing well up and walking around the unit. Patient tolerated regular diet as well. However the patient's white blood cell count continued to increase and he did have some diarrhea. A CT scan was done and revealed some fluid in the pelvis. Interventional radiology was consultative and could not place a drain in the area. Patient slowly improved with IV antibiotics. The patient was sent home with 7 days of Augmentin and Flagyl.  Consults: None  Significant Diagnostic Studies: CT  Treatments: surgery: Lap converted to open appendectomy  Discharge Exam: Blood pressure 110/73, pulse 77, temperature 98.4 F (36.9 C), temperature source Oral, resp. rate 20, height 5\' 7"  (1.702 m), weight 186 lb (84.4 kg), SpO2 95 %. General appearance: alert, cooperative and no distress GI: Soft, appropriately tender, laparoscopic incisions sutured clean dry and intact, right lower quadrant incision Penrose removed with minimal serous drainage stitches in place, no erythema or purulence Extremities: extremities normal, atraumatic, no cyanosis or edema  Disposition: 01-Home or Self Care  Discharge Instructions    Activity as tolerated - No restrictions    Complete by:  As directed    Call MD for:  difficulty breathing, headache or visual disturbances    Complete by:  As directed    Call MD for:  persistant nausea and vomiting    Complete by:  As directed    Call MD for:  redness, tenderness, or signs of infection (pain,  swelling, redness, odor or green/yellow discharge around incision site)    Complete by:  As directed    Call MD for:  severe uncontrolled pain    Complete by:  As directed    Call MD for:  temperature >100.4    Complete by:  As directed    Diet general    Complete by:  As directed    Discontinue JP/Blake drain    Complete by:  As directed    Driving Restrictions    Complete by:  As directed    No driving while on prescription pain medication   Increase activity slowly    Complete by:  As directed    Lifting restrictions    Complete by:  As directed    No lifting over 15lbs for 4 weeks   May shower / Bathe    Complete by:  As directed    No dressing needed    Complete by:  As directed        Medication List    TAKE these medications   acetaminophen 500 MG tablet Commonly known as:  TYLENOL Take 500 mg by mouth every 6 (six) hours as needed.   amoxicillin-clavulanate 875-125 MG tablet Commonly known as:  AUGMENTIN Take 1 tablet by mouth 2 (two) times daily.   aspirin 81 MG chewable tablet Chew 81 mg by mouth daily.   HYDROcodone-acetaminophen 5-325 MG tablet Commonly known as:  NORCO/VICODIN Take 1-2 tablets by mouth every 4 (four) hours as needed for moderate pain.   metroNIDAZOLE 500 MG tablet Commonly known as:  FLAGYL Take 1 tablet (500 mg total) by  mouth 3 (three) times daily.   ondansetron 8 MG disintegrating tablet Commonly known as:  ZOFRAN ODT Take 1 tablet (8 mg total) by mouth 2 (two) times daily.      Follow-up Information    Gladis Riffleatherine L Sayra Frisby, MD. Go on 09/06/2016.   Specialty:  Surgery Why:  f/u next week with Dr. Orvis BrillLoflin @ 3:15 PM Contact information: [address] [place] [id number] [phone]           Signed: Gladis RiffleCatherine L Elouise Divelbiss 08/31/2016, 4:13 PM

## 2016-09-06 ENCOUNTER — Other Ambulatory Visit: Payer: Self-pay

## 2016-09-06 ENCOUNTER — Encounter: Payer: Self-pay | Admitting: Surgery

## 2016-09-06 ENCOUNTER — Ambulatory Visit (INDEPENDENT_AMBULATORY_CARE_PROVIDER_SITE_OTHER): Payer: BC Managed Care – PPO | Admitting: Surgery

## 2016-09-06 VITALS — BP 121/81 | HR 78 | Temp 98.6°F | Wt 182.0 lb

## 2016-09-06 DIAGNOSIS — E785 Hyperlipidemia, unspecified: Secondary | ICD-10-CM | POA: Insufficient documentation

## 2016-09-06 DIAGNOSIS — K3532 Acute appendicitis with perforation and localized peritonitis, without abscess: Secondary | ICD-10-CM

## 2016-09-06 DIAGNOSIS — K352 Acute appendicitis with generalized peritonitis: Secondary | ICD-10-CM

## 2016-09-06 NOTE — Progress Notes (Signed)
60 year old male who had a laparoscopic converted to open appendectomy for perforated appendicitis by Dr. Michela PitcherEly on 11/1. Patient has been doing well since he has been home. The patient states some soreness but not enough to need to take some pain medicine. The patient states that he took some Tylenol for his headache which is worse than the pain in his abdomen. The patient and slowly getting his appetite back and his energy back. Patient is been having regular stools although they've been slightly more soft. He has not had any further diarrhea stools. Patient has not experienced any fever chills or increased pain in the right lower quadrant. 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   Vitals:   09/06/16 1701  BP: 121/81  Pulse: 78  Temp: 98.6 F (37 C)   PE:  Gen: NAD Abd: soft, Appropriately tender, stitches removed from laparoscopic sites as well as the open site. Clean dry and intact no erythema or drainage   A/P:  He has been healing as expected after a open appendectomy. He has completed his antibiotics today and has been doing overall very well. 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 I also instructed him to call with any fever chills or any questions or concerns otherwise he does not have to return for a scheduled appointment.

## 2016-09-06 NOTE — Patient Instructions (Signed)

## 2017-03-25 IMAGING — CT CT PELVIS W/ CM
2 of 3 series · 16 of 46 positions shown, 18 images · IV contrast (APPLIED)
Comparison: 08/21/2016, 08/26/2016

CLINICAL DATA: Status post appendectomy with persistent elevated
white blood cell count

EXAM:
CT PELVIS WITH CONTRAST
TECHNIQUE: Multidetector CT imaging of the pelvis was performed using the
standard protocol following the bolus administration of intravenous
contrast.
CONTRAST:  100mL PAKHCX-BLL IOPAMIDOL (PAKHCX-BLL) INJECTION 61%

[Series 2: axial st · axial · 0.88mm/px · z∈[-1078,-808]mm · 13 of 63 slices shown, 15 images]
[im 5/63  soft-tissue]
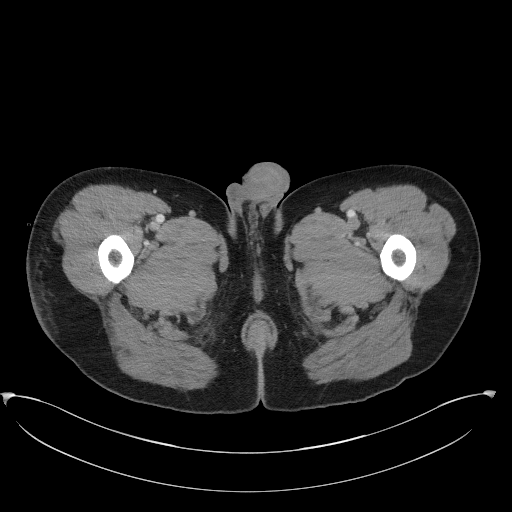
[im 5/63  bone]
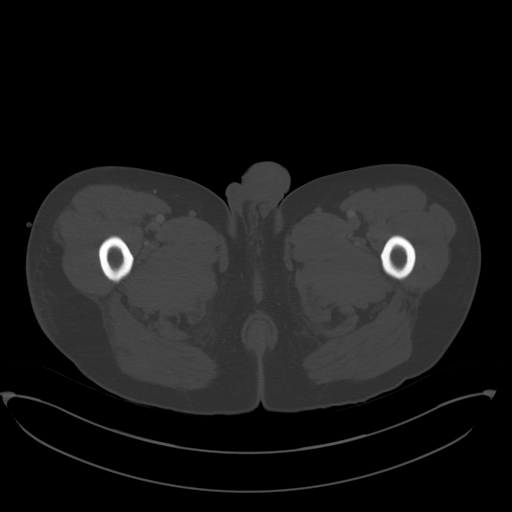
[im 9/63  soft-tissue]
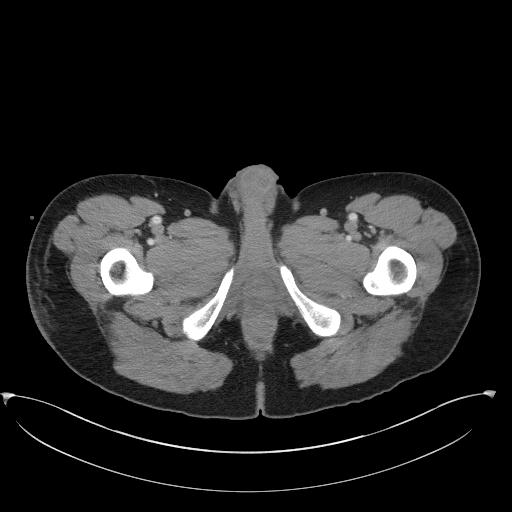
[im 13/63  soft-tissue]
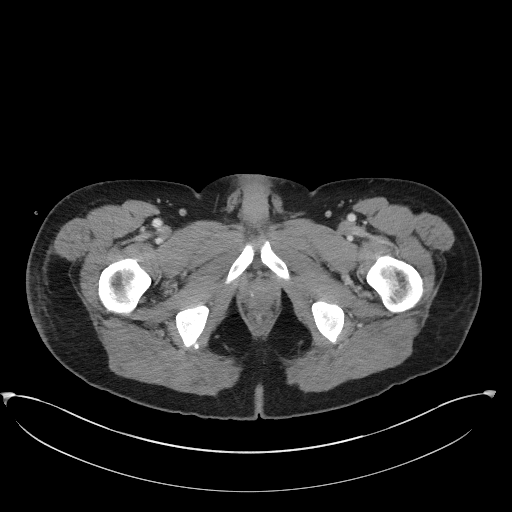
[im 19/63  soft-tissue]
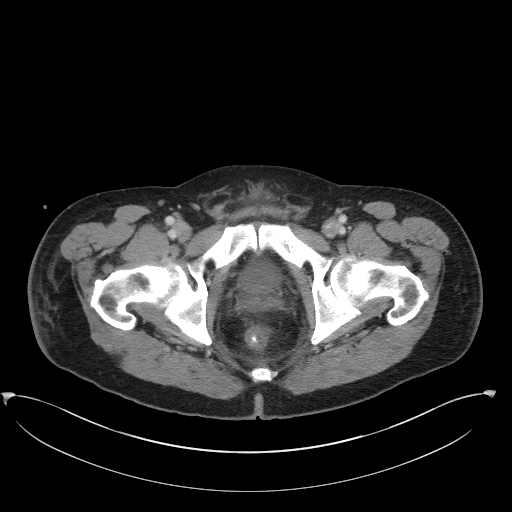
[im 23/63  soft-tissue]
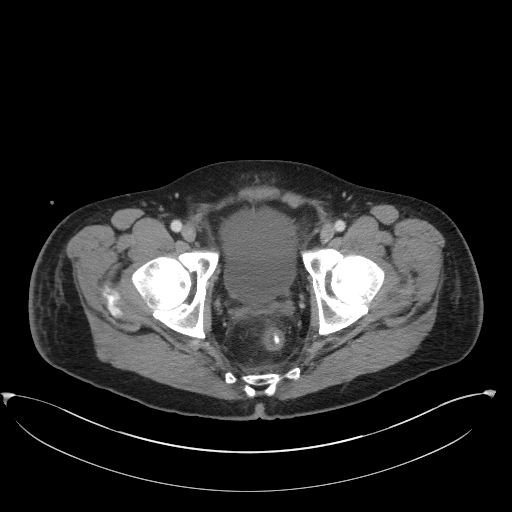
[im 27/63  soft-tissue]
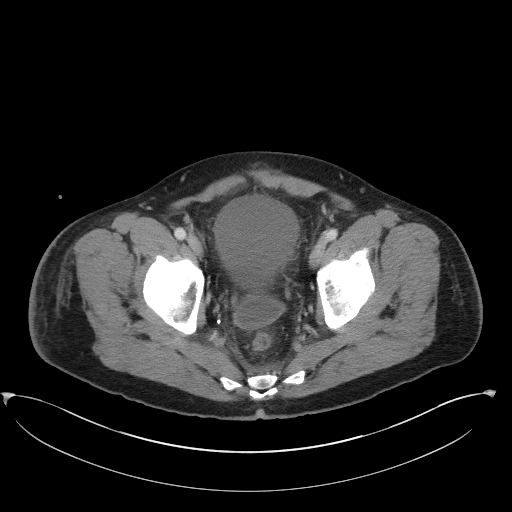
[im 33/63  soft-tissue]
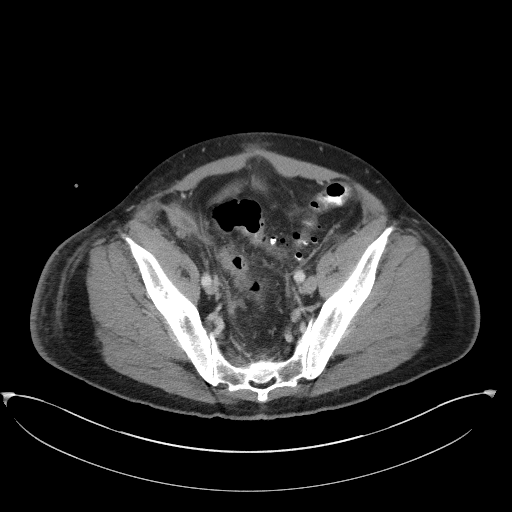
[im 37/63  soft-tissue]
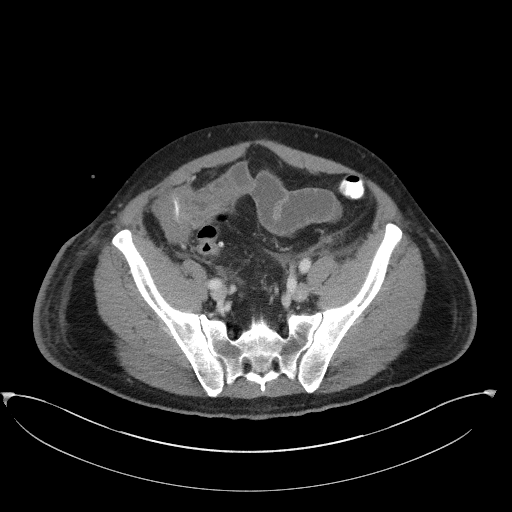
[im 41/63  soft-tissue]
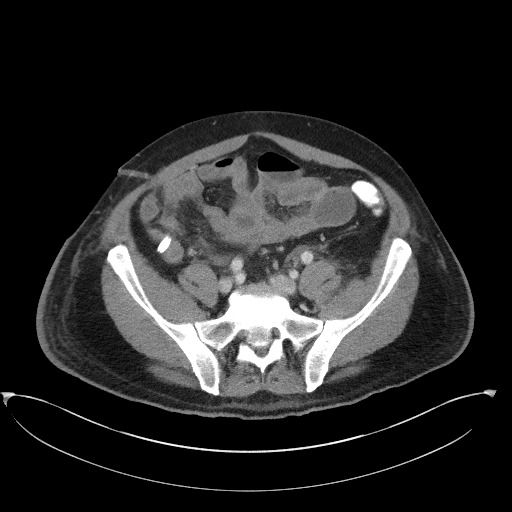
[im 41/63  bone]
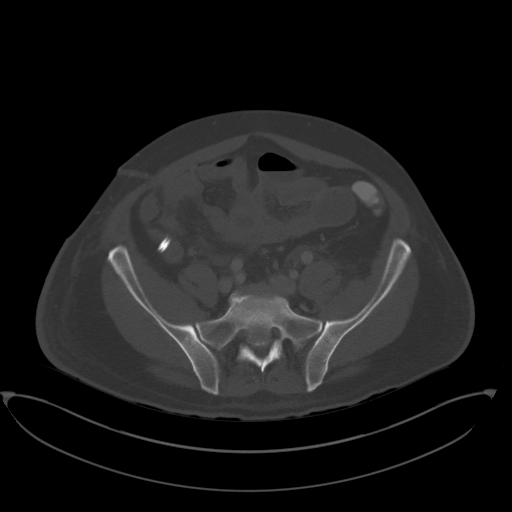
[im 45/63  soft-tissue]
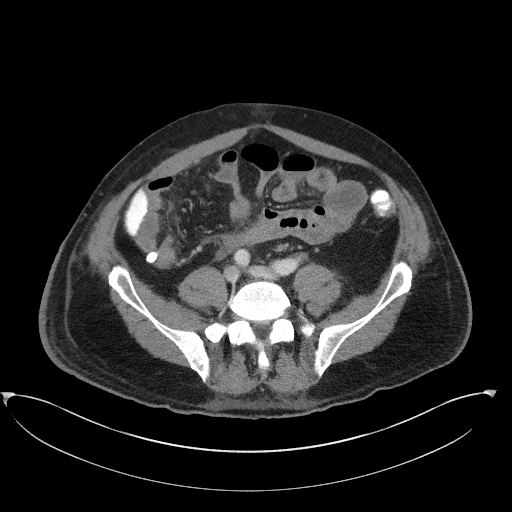
[im 51/63  soft-tissue]
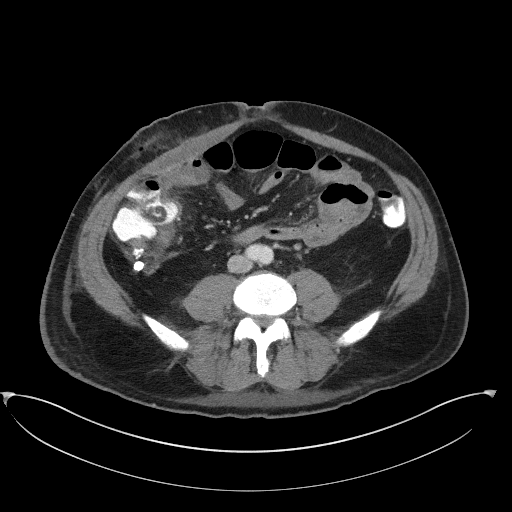
[im 55/63  soft-tissue]
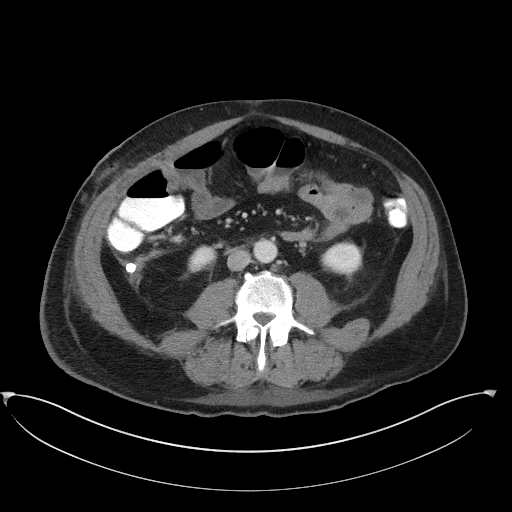
[im 59/63  soft-tissue]
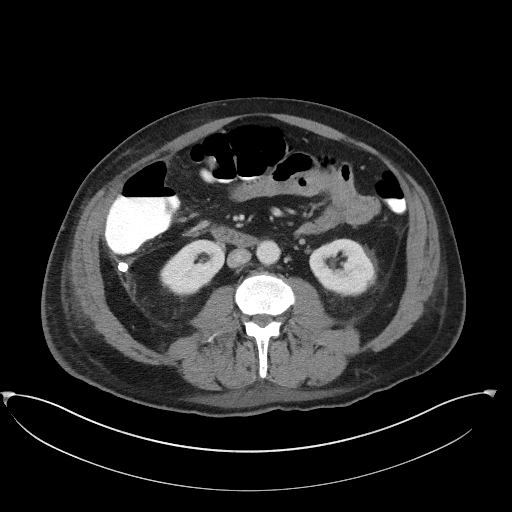

[Series 4: coronal st · coronal · 0.64mm/px · 3 of 91 slices shown]
[im 31/91  soft-tissue]
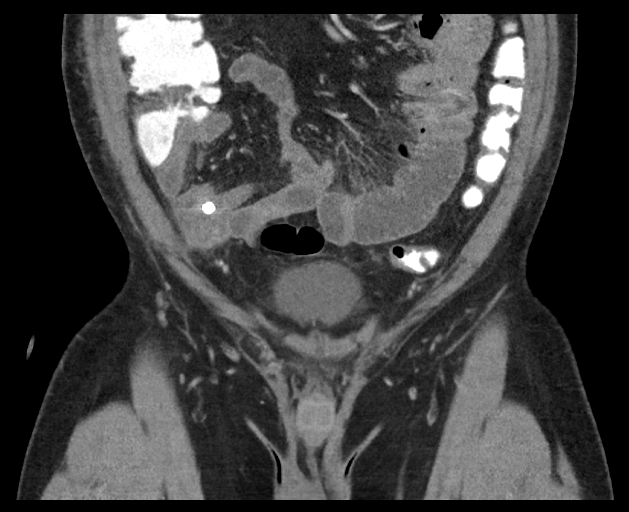
[im 41/91  soft-tissue]
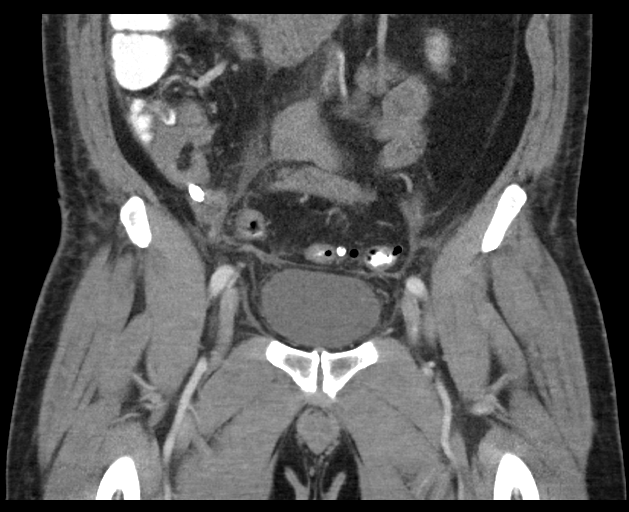
[im 51/91  soft-tissue]
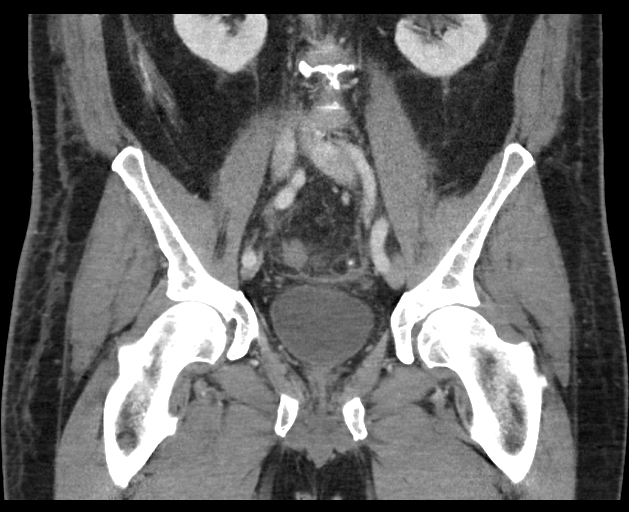

[16 of 46 positions shown; findings below may reference images not displayed]

FINDINGS: Urinary Tract: Bladder is well distended. Visualized portions of the
kidneys are within normal limits.

Bowel: The colon is again well visualized with changes of
diverticulosis. No definitive diverticulitis is seen. There are
changes consistent with a prior history of appendectomy. The fluid
collection in the right lower quadrant just below the surgical drain
has improved significantly and only demonstrates a minimal residual
amount of fluid. The previously described collection medial to the
cecum has resolved as well.

Vascular/Lymphatic: No pathologically enlarged lymph nodes. No
significant vascular abnormality seen.

Reproductive:  Prostate is within normal limits.

Other: The free pelvic fluid has decreased in amount and size when
compared with the prior exam. Some persistent inflammatory changes
are noted in the pelvis. The surgical drain is again seen and
stable.

Musculoskeletal: Stable from the prior exam.
IMPRESSION: Improved fluid collections both in the right lower quadrant and
medial to the cecum as described above.

The degree of free fluid within the pelvis continues to decrease
from the prior exam. Continued follow-up is recommended. No
percutaneous drain will be performed at this time.

## 2022-12-18 ENCOUNTER — Ambulatory Visit: Payer: BC Managed Care – PPO | Admitting: Nurse Practitioner

## 2022-12-18 ENCOUNTER — Encounter: Payer: Self-pay | Admitting: Nurse Practitioner

## 2022-12-18 VITALS — BP 124/78 | HR 78 | Temp 98.4°F | Resp 16 | Ht 67.0 in | Wt 188.0 lb

## 2022-12-18 DIAGNOSIS — R7301 Impaired fasting glucose: Secondary | ICD-10-CM | POA: Diagnosis not present

## 2022-12-18 DIAGNOSIS — E782 Mixed hyperlipidemia: Secondary | ICD-10-CM | POA: Diagnosis not present

## 2022-12-18 DIAGNOSIS — Z1212 Encounter for screening for malignant neoplasm of rectum: Secondary | ICD-10-CM

## 2022-12-18 DIAGNOSIS — Z23 Encounter for immunization: Secondary | ICD-10-CM

## 2022-12-18 DIAGNOSIS — Z1211 Encounter for screening for malignant neoplasm of colon: Secondary | ICD-10-CM

## 2022-12-18 LAB — POCT GLYCOSYLATED HEMOGLOBIN (HGB A1C): Hemoglobin A1C: 5.2 % (ref 4.0–5.6)

## 2022-12-18 MED ORDER — ZOSTER VAC RECOMB ADJUVANTED 50 MCG/0.5ML IM SUSR: 0.5000 mL | Freq: Once | INTRAMUSCULAR | 0 refills | Status: AC

## 2022-12-18 MED ORDER — TETANUS-DIPHTH-ACELL PERTUSSIS 5-2.5-18.5 LF-MCG/0.5 IM SUSP: 0.5000 mL | Freq: Once | INTRAMUSCULAR | 0 refills | Status: AC

## 2022-12-18 NOTE — Progress Notes (Signed)
[facility] [place], [place] [address]  Internal MEDICINE  Office Visit Note  Patient Name: Andrew Hopkins  [id number]  [id number]  Date of Service: 12/18/2022   Complaints/HPI Pt is here for establishment of PCP. Chief Complaint  Patient presents with   New Patient (Initial Visit)    HPI Andrew Hopkins presents for a new patient visit to establish care.  Well-appearing 67 y.o. 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 Home: live at home with daughter.  Diet: could be better  Exercise: physically active at work.  Tobacco use: none Alcohol use: occasionally/rarely  Illicit drug use: none  Routine CRC screening: due now  Labs: due for routine labs and routine annual wellness visit.  New or worsening pain: none A1c is normal at 5.2, no diabetes.  Recheck BP, which improved and was normal when rechecked   Current Medication: Outpatient Encounter Medications as of 12/18/2022  Medication Sig   [DISCONTINUED] Tdap (BOOSTRIX) 5-2.5-18.5 LF-MCG/0.5 injection Inject 0.5 mLs into the muscle once.   [DISCONTINUED] Zoster Vaccine Adjuvanted [facility]) injection Inject 0.5 mLs into the muscle once.   Tdap (BOOSTRIX) 5-2.5-18.5 LF-MCG/0.5 injection Inject 0.5 mLs into the muscle once for 1 dose.   Zoster Vaccine Adjuvanted [facility]) injection Inject 0.5 mLs into the muscle once for 1 dose.   [DISCONTINUED] acetaminophen (TYLENOL) 500 MG tablet Take 500 mg by mouth every 6 (six) hours as needed. (Patient not taking: Reported on 12/18/2022)   [DISCONTINUED] aspirin 81 MG chewable tablet Chew 81 mg by mouth daily.  (Patient not taking: Reported on 12/18/2022)   No facility-administered encounter medications on file as of 12/18/2022.    Surgical History: Past Surgical History:  Procedure Laterality Date   LAPAROSCOPIC APPENDECTOMY N/A 08/21/2016   Procedure:  APPENDECTOMY LAPAROSCOPIC Converted to Open Appendectomy;  Surgeon: Andrew Crawford III, MD;  Location: [facility] ORS;  Service: General;  Laterality: N/A;   NO PAST SURGERIES      Medical History: History reviewed. No pertinent past medical history.  Family History: Family History  Problem Relation Age of Onset   CVA Mother    Brain cancer Mother     Social History   Socioeconomic History   Marital status: Widowed    Spouse name: Not on file   Number of children: Not on file   Years of education: Not on file   Highest education level: Not on file  Occupational History   Not on file  Tobacco Use   Smoking status: Never   Smokeless tobacco: Never  Substance and Sexual Activity   Alcohol use: Not Currently    Comment: occasionally   Drug use: No   Sexual activity: Not on file  Other Topics Concern   Not on file  Social History Narrative   Not on file   Social Determinants of Health   Financial Resource Strain: Not on file  Food Insecurity: Not on file  Transportation Needs: Not on file  Physical Activity: Not on file  Stress: Not on file  Social Connections: Not on file  Intimate Partner Violence: Not on file     Review of Systems  Constitutional:  Negative for chills, fatigue and unexpected weight change.  HENT:  Negative for congestion, postnasal drip, rhinorrhea, sneezing and sore throat.   Eyes:  Negative for redness.  Respiratory:  Negative for cough, chest tightness and shortness of breath.   Cardiovascular:  Negative for chest pain and palpitations.  Gastrointestinal:  Negative for abdominal pain, constipation, diarrhea, nausea and vomiting.  Genitourinary:  Negative for dysuria and frequency.  Musculoskeletal:  Negative for arthralgias, back pain, joint swelling and neck pain.  Skin:  Negative for rash.  Neurological: Negative.  Negative for tremors and numbness.  Hematological:  Negative for adenopathy. Does not bruise/bleed easily.  Psychiatric/Behavioral:   Negative for behavioral problems (Depression), sleep disturbance and suicidal ideas. The patient is not nervous/anxious.     Vital Signs: BP 124/78 Comment: 144/91  Pulse 78   Temp 98.4 F (36.9 C)   Resp 16   Ht '5\' 7"'$  (1.702 m)   Wt 188 lb (85.3 kg)   SpO2 98%   BMI 29.44 kg/m    Physical Exam Vitals reviewed.  Constitutional:      General: He is not in acute distress.    Appearance: Normal appearance. He is not ill-appearing.  HENT:     Head: Normocephalic and atraumatic.  Eyes:     Pupils: Pupils are equal, round, and reactive to light.  Cardiovascular:     Rate and Rhythm: Normal rate and regular rhythm.  Pulmonary:     Effort: Pulmonary effort is normal. No respiratory distress.  Neurological:     Mental Status: He is alert and oriented to person, place, and time.  Psychiatric:        Mood and Affect: Mood normal.        Behavior: Behavior normal.      Assessment/Plan: 1. Mixed hyperlipidemia Routine labs ordered - CBC with Differential/Platelet - CMP14+EGFR - Lipid Profile  2. Impaired fasting glucose A1c is normal today, routine labs ordered - POCT glycosylated hemoglobin (Hb A1C) - CBC with Differential/Platelet - CMP14+EGFR - Lipid Profile  3. Screening for colorectal cancer Referred to GI for routine colonoscopy - Ambulatory referral to Gastroenterology  4. Need for vaccination - Tdap ([place]) 5-2.5-18.5 LF-MCG/0.5 injection; Inject 0.5 mLs into the muscle once for 1 dose.  Dispense: 0.5 mL; Refill: 0 - Zoster Vaccine Adjuvanted [facility]) injection; Inject 0.5 mLs into the muscle once for 1 dose.  Dispense: 0.5 mL; Refill: 0     General Counseling: Andrew Hopkins understanding of the findings of todays visit and agrees with plan of treatment. I have discussed any further diagnostic evaluation that may be needed or ordered today. We also reviewed his medications today. he has been encouraged to call the office with any questions or concerns  that should arise related to todays visit.    Orders Placed This Encounter  Procedures   Ambulatory referral to Gastroenterology   POCT glycosylated hemoglobin (Hb A1C)    Meds ordered this encounter  Medications   Tdap (BOOSTRIX) 5-2.5-18.5 LF-MCG/0.5 injection    Sig: Inject 0.5 mLs into the muscle once for 1 dose.    Dispense:  0.5 mL    Refill:  0   Zoster Vaccine Adjuvanted [facility]) injection    Sig: Inject 0.5 mLs into the muscle once for 1 dose.    Dispense:  0.5 mL    Refill:  0    Return for CPE, Jaysten Essner PCP at earliest available opening, please have labs done prior to appt.  Time spent:30 Minutes Time spent with patient included reviewing progress notes, labs, imaging studies, and discussing plan for follow up.   [place] Controlled Substance Database was reviewed by me for overdose risk score (ORS)   This patient was seen by Jonetta Osgood, FNP-C in collaboration with Dr.  Clayborn Bigness as a part of collaborative care agreement.   Aimi Essner R. Valetta Fuller, MSN, FNP-C Internal Medicine

## 2022-12-26 ENCOUNTER — Encounter: Payer: Self-pay | Admitting: *Deleted

## 2023-01-08 ENCOUNTER — Ambulatory Visit (INDEPENDENT_AMBULATORY_CARE_PROVIDER_SITE_OTHER): Payer: BC Managed Care – PPO | Admitting: Nurse Practitioner

## 2023-01-08 ENCOUNTER — Encounter: Payer: Self-pay | Admitting: Nurse Practitioner

## 2023-01-08 VITALS — BP 122/86 | HR 61 | Temp 98.3°F | Resp 16 | Ht 67.0 in | Wt 184.4 lb

## 2023-01-08 DIAGNOSIS — E782 Mixed hyperlipidemia: Secondary | ICD-10-CM

## 2023-01-08 DIAGNOSIS — Z1212 Encounter for screening for malignant neoplasm of rectum: Secondary | ICD-10-CM

## 2023-01-08 DIAGNOSIS — Z0001 Encounter for general adult medical examination with abnormal findings: Secondary | ICD-10-CM

## 2023-01-08 DIAGNOSIS — Z1211 Encounter for screening for malignant neoplasm of colon: Secondary | ICD-10-CM

## 2023-01-08 NOTE — Progress Notes (Signed)
[facility] [place], [place] [address]  Internal MEDICINE  Office Visit Note  Patient Name: Andrew Hopkins  [id number]  [id number]  Date of Service: 01/08/2023  Chief Complaint  Patient presents with   Annual Exam    HPI Andrew Hopkins presents for an annual well visit and physical exam.  Well-appearing 67 y.o. male with high cholesterol and impaired fasting glucose.  Routine CRC screening: needs to call to schedule colonoscopy Labs: reminded patient to get labs done  New or worsening pain: none  Other concerns: none    Current Medication: No outpatient encounter medications on file as of 01/08/2023.   No facility-administered encounter medications on file as of 01/08/2023.    Surgical History: Past Surgical History:  Procedure Laterality Date   LAPAROSCOPIC APPENDECTOMY N/A 08/21/2016   Procedure: APPENDECTOMY LAPAROSCOPIC Converted to Open Appendectomy;  Surgeon: Andrew Crawford III, MD;  Location: [facility] ORS;  Service: General;  Laterality: N/A;   NO PAST SURGERIES      Medical History: History reviewed. No pertinent past medical history.  Family History: Family History  Problem Relation Age of Onset   CVA Mother    Brain cancer Mother     Social History   Socioeconomic History   Marital status: Widowed    Spouse name: Not on file   Number of children: Not on file   Years of education: Not on file   Highest education level: Not on file  Occupational History   Not on file  Tobacco Use   Smoking status: Never   Smokeless tobacco: Never  Substance and Sexual Activity   Alcohol use: Not Currently    Comment: occasionally   Drug use: No   Sexual activity: Not on file  Other Topics Concern   Not on file  Social History Narrative   Not on file   Social Determinants of Health   Financial Resource Strain: Not on file  Food Insecurity: Not on file  Transportation Needs: Not on file  Physical Activity: Not on file  Stress: Not on file  Social  Connections: Not on file  Intimate Partner Violence: Not on file      Review of Systems  Constitutional:  Negative for activity change, appetite change, chills, fatigue, fever and unexpected weight change.  HENT: Negative.  Negative for congestion, ear pain, rhinorrhea, sore throat and trouble swallowing.   Eyes: Negative.   Respiratory: Negative.  Negative for cough, chest tightness, shortness of breath and wheezing.   Cardiovascular: Negative.  Negative for chest pain.  Gastrointestinal: Negative.  Negative for abdominal pain, blood in stool, constipation, diarrhea, nausea and vomiting.  Endocrine: Negative.   Genitourinary: Negative.  Negative for difficulty urinating, dysuria, frequency, hematuria and urgency.  Musculoskeletal: Negative.  Negative for arthralgias, back pain, joint swelling, myalgias and neck pain.  Skin: Negative.  Negative for rash and wound.  Allergic/Immunologic: Negative.  Negative for immunocompromised state.  Neurological: Negative.  Negative for dizziness, seizures, numbness and headaches.  Hematological: Negative.   Psychiatric/Behavioral: Negative.  Negative for behavioral problems, self-injury and suicidal ideas. The patient is not nervous/anxious.     Vital Signs: BP 122/86   Pulse 61   Temp 98.3 F (36.8 C)   Resp 16   Ht 5\' 7"  (1.702 m)   Wt 184 lb 6.4 oz (83.6 kg)   SpO2 93%   BMI 28.88 kg/m    Physical Exam Vitals reviewed.  Constitutional:      General: He is not in acute  distress.    Appearance: He is well-developed. He is not diaphoretic.  HENT:     Head: Normocephalic and atraumatic.     Right Ear: External ear normal.     Left Ear: External ear normal.     Nose: Nose normal. No congestion or rhinorrhea.     Mouth/Throat:     Mouth: Mucous membranes are moist.     Pharynx: Oropharynx is clear. No oropharyngeal exudate or posterior oropharyngeal erythema.  Eyes:     General: No scleral icterus.       Right eye: No discharge.         Left eye: No discharge.     Extraocular Movements: Extraocular movements intact.     Conjunctiva/sclera: Conjunctivae normal.     Pupils: Pupils are equal, round, and reactive to light.  Neck:     Thyroid: No thyromegaly.     Vascular: No JVD.     Trachea: No tracheal deviation.  Cardiovascular:     Rate and Rhythm: Normal rate and regular rhythm.     Heart sounds: Normal heart sounds. No murmur heard.    No friction rub. No gallop.  Pulmonary:     Effort: Pulmonary effort is normal. No respiratory distress.     Breath sounds: Normal breath sounds. No stridor. No wheezing or rales.  Chest:     Chest wall: No tenderness.  Abdominal:     General: Bowel sounds are normal. There is no distension.     Palpations: Abdomen is soft. There is no mass.     Tenderness: There is no abdominal tenderness. There is no guarding or rebound.  Musculoskeletal:        General: No tenderness or deformity. Normal range of motion.     Cervical back: Normal range of motion and neck supple.  Lymphadenopathy:     Cervical: No cervical adenopathy.  Skin:    General: Skin is warm and dry.     Coloration: Skin is not pale.     Findings: No erythema or rash.  Neurological:     Mental Status: He is alert and oriented to person, place, and time.     Cranial Nerves: No cranial nerve deficit.     Motor: No abnormal muscle tone.     Coordination: Coordination normal.     Deep Tendon Reflexes: Reflexes are normal and symmetric.  Psychiatric:        Behavior: Behavior normal.        Thought Content: Thought content normal.        Judgment: Judgment normal.        Assessment/Plan: 1. Encounter for routine adult health examination with abnormal findings Age-appropriate preventive screenings and vaccinations discussed, annual physical exam completed. Routine labs for health maintenance ordered, reminded patient to get labs drawn. PHM updated.   2. Mixed hyperlipidemia Waiting for patient to get labs  drawn, reminded him   3. Screening for colorectal cancer Referral for GI previously ordered, patient will call to schedule     General Counseling: Andrew Hopkins understanding of the findings of todays visit and agrees with plan of treatment. I have discussed any further diagnostic evaluation that may be needed or ordered today. We also reviewed his medications today. he has been encouraged to call the office with any questions or concerns that should arise related to todays visit.    No orders of the defined types were placed in this encounter.   No orders of the defined types were placed  in this encounter.   Return in about 1 year (around 01/08/2024) for CPE, Vivian PCP and otherwise as needed .   Total time spent:30 Minutes Time spent includes review of chart, medications, test results, and follow up plan with the patient.   [place] Controlled Substance Database was reviewed by me.  This patient was seen by Jonetta Osgood, FNP-C in collaboration with Dr. Clayborn Bigness as a part of collaborative care agreement.  Ulla Mckiernan R. Valetta Fuller, MSN, FNP-C Internal medicine

## 2023-02-05 ENCOUNTER — Encounter: Payer: Self-pay | Admitting: *Deleted

## 2023-03-27 ENCOUNTER — Telehealth: Payer: Self-pay | Admitting: Nurse Practitioner

## 2023-03-27 NOTE — Telephone Encounter (Signed)
Lvm and sent message to patient to return my call regarding GI referral sent out in February. [facility] has left several messages with no return call-Toni

## 2023-04-04 ENCOUNTER — Encounter: Payer: Self-pay | Admitting: *Deleted

## 2023-05-12 ENCOUNTER — Ambulatory Visit (INDEPENDENT_AMBULATORY_CARE_PROVIDER_SITE_OTHER): Payer: BC Managed Care – PPO | Admitting: Physician Assistant

## 2023-05-12 ENCOUNTER — Encounter: Payer: Self-pay | Admitting: Physician Assistant

## 2023-05-12 VITALS — BP 145/90 | HR 78 | Temp 98.3°F | Resp 16 | Ht 67.0 in | Wt 193.8 lb

## 2023-05-12 DIAGNOSIS — R030 Elevated blood-pressure reading, without diagnosis of hypertension: Secondary | ICD-10-CM

## 2023-05-12 DIAGNOSIS — M5442 Lumbago with sciatica, left side: Secondary | ICD-10-CM | POA: Diagnosis not present

## 2023-05-12 DIAGNOSIS — M5441 Lumbago with sciatica, right side: Secondary | ICD-10-CM | POA: Diagnosis not present

## 2023-05-12 NOTE — Progress Notes (Signed)
[facility] [address] [place], [place] [address]  Internal MEDICINE  Office Visit Note  Patient Name: Andrew Hopkins  [id number]  [id number]  Date of Service: 05/13/2023  Chief Complaint  Patient presents with   Acute Visit   Back Pain    Back pain ongoing since May, radiates down into glutes and legs. Patient does not recall any injuries.     HPI Pt is here for a sick visit. -Lower back pain, bilateral, but right side worse. Shooting pain down into both legs with some numbness. Worse in AM and gets better throughout the day, but then comes back again. Also hurts when he goes to sit down. Gets numbness in right hip as well. -No injury that he is aware of -Also stands on tile floor all day. 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  -not taking any medications, will take hot shower to help  -discussed possible medications including gabapentin and anti-inflammatories. Pt is interested in going ahead and establishing with ortho to get imaging and treatment done in one location. He declines wanting to start any medications at this time so that he can monitor any pain progression until ortho eval. He will call if this changes. -Denies any loss of bowel or bladder control and understands if this develops or sudden worsening in pain to go to ED  Current Medication:  No outpatient encounter medications on file as of 05/12/2023.   No facility-administered encounter medications on file as of 05/12/2023.      Medical History: History reviewed. No pertinent past medical history.   Vital Signs: BP (!) 145/90   Pulse 78   Temp 98.3 F (36.8 C)   Resp 16   Ht 5\' 7"  (1.702 m)   Wt 193 lb 12.8 oz (87.9 kg)   SpO2 96%   BMI 30.35 kg/m    Review of Systems  Constitutional:  Negative for fatigue and fever.  HENT:  Negative for congestion, mouth sores and postnasal drip.   Respiratory:  Negative for cough.    Cardiovascular:  Negative for chest pain.  Genitourinary:  Negative for flank pain.  Musculoskeletal:  Positive for back pain.       Bilateral low back pain shooting into legs  Skin:  Negative for color change.  Neurological:  Positive for numbness.  Psychiatric/Behavioral: Negative.      Physical Exam Constitutional:      General: He is not in acute distress.    Appearance: He is well-developed. He is not diaphoretic.  HENT:     Head: Normocephalic and atraumatic.     Mouth/Throat:     Pharynx: No oropharyngeal exudate.  Eyes:     Pupils: Pupils are equal, round, and reactive to light.  Neck:     Thyroid: No thyromegaly.     Vascular: No JVD.     Trachea: No tracheal deviation.  Cardiovascular:     Rate and Rhythm: Normal rate and regular rhythm.     Heart sounds: Normal heart sounds. No murmur heard.    No friction rub. No gallop.  Pulmonary:     Effort: Pulmonary effort is normal. No respiratory distress.     Breath sounds: No wheezing or rales.  Chest:     Chest wall: No tenderness.  Abdominal:     Palpations: Abdomen is soft.  Musculoskeletal:        General: No swelling or tenderness. Normal  range of motion.     Cervical back: Normal range of motion and neck supple.     Comments: Pain in low back that radiates into legs. Pain present with SLR, but not much worse than before leg raise. AROM slightly worse than PROM. No tenderness to palpation along low back/spine  Lymphadenopathy:     Cervical: No cervical adenopathy.  Skin:    General: Skin is warm and dry.  Neurological:     Mental Status: He is alert and oriented to person, place, and time.     Cranial Nerves: No cranial nerve deficit.  Psychiatric:        Behavior: Behavior normal.        Thought Content: Thought content normal.        Judgment: Judgment normal.       Assessment/Plan: 1. Acute bilateral low back pain with bilateral sciatica Offered medications to help including gabapentin and  meloxicam, but pt declines for now. Would prefer to establish with ortho prior to starting any meds. He may call if he changes his mind. Advised to call or go to ED if new or worsening symptoms arise. - Ambulatory referral to Orthopedics  2. Elevated BP without diagnosis of hypertension Elevated likely due to pain, but patient will start monitoring and will schedule nurse visit for BP recheck in a few weeks.   General Counseling: levaughn puccinelli understanding of the findings of todays visit and agrees with plan of treatment. I have discussed any further diagnostic evaluation that may be needed or ordered today. We also reviewed his medications today. he has been encouraged to call the office with any questions or concerns that should arise related to todays visit.    Counseling:    Orders Placed This Encounter  Procedures   Ambulatory referral to Orthopedics    No orders of the defined types were placed in this encounter.   Time spent:30 Minutes

## 2023-05-13 ENCOUNTER — Telehealth: Payer: Self-pay | Admitting: Physician Assistant

## 2023-05-13 NOTE — Telephone Encounter (Signed)
Awaiting 05/12/23 office notes for Orthopedic-Toni

## 2023-05-14 ENCOUNTER — Telehealth: Payer: Self-pay | Admitting: Physician Assistant

## 2023-05-14 NOTE — Telephone Encounter (Signed)
Orthopedic referral sent via [facility] to [facility]-Toni 

## 2023-05-16 ENCOUNTER — Telehealth: Payer: Self-pay | Admitting: Nurse Practitioner

## 2023-05-16 NOTE — Telephone Encounter (Signed)
Orthopedic appointment 05/26/2023 @ [facility]-Toni

## 2023-05-28 ENCOUNTER — Other Ambulatory Visit: Payer: Self-pay | Admitting: Orthopedic Surgery

## 2023-05-28 DIAGNOSIS — M4807 Spinal stenosis, lumbosacral region: Secondary | ICD-10-CM

## 2023-06-02 ENCOUNTER — Ambulatory Visit: Payer: BC Managed Care – PPO | Admitting: Nurse Practitioner

## 2023-06-02 NOTE — Progress Notes (Unsigned)
Patient here for BP check. Patient's BP today is 125/90. After rechecking, it was 132/74.

## 2023-06-05 ENCOUNTER — Ambulatory Visit
Admission: RE | Admit: 2023-06-05 | Discharge: 2023-06-05 | Disposition: A | Discharge status: Home / Self Care | Payer: BC Managed Care – PPO | Source: Ambulatory Visit | Attending: Orthopedic Surgery | Admitting: Orthopedic Surgery

## 2023-06-05 DIAGNOSIS — M4807 Spinal stenosis, lumbosacral region: Secondary | ICD-10-CM

## 2023-06-09 ENCOUNTER — Encounter: Payer: Self-pay | Admitting: *Deleted

## 2024-01-08 ENCOUNTER — Telehealth: Payer: Self-pay | Admitting: Nurse Practitioner

## 2024-01-08 NOTE — Telephone Encounter (Signed)
 Left vm and sent mychart message to confirm 01/12/24 appointment-Toni

## 2024-01-12 ENCOUNTER — Ambulatory Visit: Payer: BC Managed Care – PPO | Admitting: Nurse Practitioner

## 2024-01-12 ENCOUNTER — Encounter: Payer: Self-pay | Admitting: Nurse Practitioner

## 2024-01-12 VITALS — BP 134/88 | HR 98 | Temp 98.4°F | Resp 16 | Ht 67.0 in | Wt 188.8 lb

## 2024-01-12 DIAGNOSIS — R7301 Impaired fasting glucose: Secondary | ICD-10-CM

## 2024-01-12 DIAGNOSIS — E782 Mixed hyperlipidemia: Secondary | ICD-10-CM | POA: Diagnosis not present

## 2024-01-12 DIAGNOSIS — Z0001 Encounter for general adult medical examination with abnormal findings: Secondary | ICD-10-CM

## 2024-01-12 NOTE — Progress Notes (Signed)
 [facility] [address] [place], [place] [address]  Internal MEDICINE  Office Visit Note  Patient Name: Andrew Hopkins  [id number]  [id number]  Date of Service: 01/12/2024  Chief Complaint  Patient presents with   Annual Exam    HPI Andrew Hopkins presents for an annual well visit and physical exam.  Well-appearing 68 y.o. male with high cholesterol and elevated glucose. Not currently on any medications. Routine CRC screening: reminded patient to call GI office to schedule.  Labs: due for routine labs  New or worsening pain: none Other concerns: overdue for labs, he did not have them done last year    Current Medication: No outpatient encounter medications on file as of 01/12/2024.   No facility-administered encounter medications on file as of 01/12/2024.    Surgical History: Past Surgical History:  Procedure Laterality Date   LAPAROSCOPIC APPENDECTOMY N/A 08/21/2016   Procedure: APPENDECTOMY LAPAROSCOPIC Converted to Open Appendectomy;  Surgeon: Rhina Center III, MD;  Location: [facility] ORS;  Service: General;  Laterality: N/A;   NO PAST SURGERIES      Medical History: History reviewed. No pertinent past medical history.  Family History: Family History  Problem Relation Age of Onset   CVA Mother    Brain cancer Mother     Social History   Socioeconomic History   Marital status: Widowed    Spouse name: Not on file   Number of children: Not on file   Years of education: Not on file   Highest education level: Not on file  Occupational History   Not on file  Tobacco Use   Smoking status: Never   Smokeless tobacco: Never  Substance and Sexual Activity   Alcohol use: Not Currently    Comment: occasionally   Drug use: No   Sexual activity: Not on file  Other Topics Concern   Not on file  Social History Narrative   Not on file   Social Drivers of Health   Financial Resource Strain: Not on file  Food Insecurity: Not on file  Transportation Needs: Not on  file  Physical Activity: Not on file  Stress: Not on file  Social Connections: Not on file  Intimate Partner Violence: Not on file      Review of Systems  Constitutional:  Negative for activity change, appetite change, chills, fatigue, fever and unexpected weight change.  HENT: Negative.  Negative for congestion, ear pain, rhinorrhea, sore throat and trouble swallowing.   Eyes: Negative.   Respiratory: Negative.  Negative for cough, chest tightness, shortness of breath and wheezing.   Cardiovascular: Negative.  Negative for chest pain.  Gastrointestinal: Negative.  Negative for abdominal pain, blood in stool, constipation, diarrhea, nausea and vomiting.  Endocrine: Negative.   Genitourinary: Negative.  Negative for difficulty urinating, dysuria, frequency, hematuria and urgency.  Musculoskeletal: Negative.  Negative for arthralgias, back pain, joint swelling, myalgias and neck pain.  Skin: Negative.  Negative for rash and wound.  Allergic/Immunologic: Negative.  Negative for immunocompromised state.  Neurological: Negative.  Negative for dizziness, seizures, numbness and headaches.  Hematological: Negative.   Psychiatric/Behavioral: Negative.  Negative for behavioral problems, self-injury and suicidal ideas. The patient is not nervous/anxious.     Vital Signs: BP 134/88   Pulse 98   Temp 98.4 F (36.9 C)   Resp 16   Ht 5\' 7"  (1.702 m)   Wt 188 lb 12.8 oz (85.6 kg)   SpO2 94%   BMI 29.57 kg/m    Physical Exam Vitals reviewed.  Constitutional:      General: He is not in acute distress.    Appearance: He is well-developed. He is not diaphoretic.  HENT:     Head: Normocephalic and atraumatic.     Right Ear: External ear normal.     Left Ear: External ear normal.     Nose: Nose normal. No congestion or rhinorrhea.     Mouth/Throat:     Mouth: Mucous membranes are moist.     Pharynx: Oropharynx is clear. No oropharyngeal exudate or posterior oropharyngeal erythema.   Eyes:     General: No scleral icterus.       Right eye: No discharge.        Left eye: No discharge.     Extraocular Movements: Extraocular movements intact.     Conjunctiva/sclera: Conjunctivae normal.     Pupils: Pupils are equal, round, and reactive to light.  Neck:     Thyroid: No thyromegaly.     Vascular: No JVD.     Trachea: No tracheal deviation.  Cardiovascular:     Rate and Rhythm: Normal rate and regular rhythm.     Heart sounds: Normal heart sounds. No murmur heard.    No friction rub. No gallop.  Pulmonary:     Effort: Pulmonary effort is normal. No respiratory distress.     Breath sounds: Normal breath sounds. No stridor. No wheezing or rales.  Chest:     Chest wall: No tenderness.  Abdominal:     General: Bowel sounds are normal. There is no distension.     Palpations: Abdomen is soft. There is no mass.     Tenderness: There is no abdominal tenderness. There is no guarding or rebound.  Musculoskeletal:        General: No tenderness or deformity. Normal range of motion.     Cervical back: Normal range of motion and neck supple.  Lymphadenopathy:     Cervical: No cervical adenopathy.  Skin:    General: Skin is warm and dry.     Coloration: Skin is not pale.     Findings: No erythema or rash.  Neurological:     Mental Status: He is alert and oriented to person, place, and time.     Cranial Nerves: No cranial nerve deficit.     Motor: No abnormal muscle tone.     Coordination: Coordination normal.     Deep Tendon Reflexes: Reflexes are normal and symmetric.  Psychiatric:        Behavior: Behavior normal.        Thought Content: Thought content normal.        Judgment: Judgment normal.        Assessment/Plan: 1. Encounter for routine adult health examination with abnormal findings (Primary) Age-appropriate preventive screenings and vaccinations discussed, annual physical exam completed. Routine labs for health maintenance ordered, see below. PHM updated.    - CBC with Differential/Platelet - CMP14+EGFR - Lipid Profile  2. Mixed hyperlipidemia Routine labs ordered  - CBC with Differential/Platelet - CMP14+EGFR - Lipid Profile  3. Impaired fasting glucose Routine labs ordered  - CBC with Differential/Platelet - CMP14+EGFR - Lipid Profile    General Counseling: ngoc malla understanding of the findings of todays visit and agrees with plan of treatment. I have discussed any further diagnostic evaluation that may be needed or ordered today. We also reviewed his medications today. he has been encouraged to call the office with any questions or concerns that should arise related to todays visit.  Orders Placed This Encounter  Procedures   CBC with Differential/Platelet   CMP14+EGFR   Lipid Profile    No orders of the defined types were placed in this encounter.   Return in about 1 year (around 01/11/2025) for CPE, Venetta Knee PCP and please get labs done. .   Total time spent:30 Minutes Time spent includes review of chart, medications, test results, and follow up plan with the patient.   [place] Controlled Substance Database was reviewed by me.  This patient was seen by Laurence Pons, FNP-C in collaboration with Dr. Verneta Gone as a part of collaborative care agreement.  Alverda Nazzaro R. Bobbi Burow, MSN, FNP-C Internal medicine

## 2024-02-27 ENCOUNTER — Encounter: Payer: Self-pay | Admitting: Nurse Practitioner

## 2024-03-25 ENCOUNTER — Telehealth: Payer: Self-pay | Admitting: Nurse Practitioner

## 2024-03-25 NOTE — Telephone Encounter (Signed)
 Lvm to schedule follow up appointment in [facility]

## 2024-03-26 ENCOUNTER — Telehealth: Payer: Self-pay | Admitting: Nurse Practitioner

## 2024-03-26 NOTE — Telephone Encounter (Signed)
 Left 2nd vm. Sent message to schedule follow up in [facility]

## 2024-03-31 ENCOUNTER — Telehealth: Payer: Self-pay | Admitting: Nurse Practitioner

## 2024-03-31 NOTE — Telephone Encounter (Signed)
 No return calls from patient. Mailed appointment letter. Scanned-Toni

## 2025-01-12 ENCOUNTER — Encounter: Admitting: Nurse Practitioner
